# Patient Record
Sex: Female | Born: 2000 | Hispanic: No | Smoking: Never smoker
Health system: Southern US, Community
[De-identification: ages and names within clinical notes are randomized; demographics above are authoritative.]

## PROBLEM LIST (undated history)

## (undated) DIAGNOSIS — M2140 Flat foot [pes planus] (acquired), unspecified foot: Secondary | ICD-10-CM

## (undated) DIAGNOSIS — Z789 Other specified health status: Secondary | ICD-10-CM

## (undated) DIAGNOSIS — R519 Headache, unspecified: Secondary | ICD-10-CM

## (undated) DIAGNOSIS — D649 Anemia, unspecified: Secondary | ICD-10-CM

## (undated) HISTORY — DX: Headache, unspecified: R51.9

## (undated) HISTORY — DX: Flat foot (pes planus) (acquired), unspecified foot: M21.40

## (undated) HISTORY — DX: Other specified health status: Z78.9

## (undated) HISTORY — PX: NOSE SURGERY: SHX723

## (undated) HISTORY — PX: WISDOM TOOTH EXTRACTION: SHX21

---
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
## 2018-02-10 ENCOUNTER — Telehealth: Payer: Self-pay

## 2018-02-10 NOTE — Telephone Encounter (Signed)
Returned call and pt stated that she has been having irregular bleeding.  Advised pt that she recently delivered, had an IUD removed Dec 2019 and was on [facility] pills this month, so this all contributes to irregular bleeding. Pt states that she stopped BC pills recently, advised to let us know if symptoms do not get better.

## 2018-07-10 ENCOUNTER — Emergency Department (HOSPITAL_COMMUNITY)
Admission: EM | Admit: 2018-07-10 | Discharge: 2018-07-10 | Disposition: A | Discharge status: Home / Self Care | Payer: Medicaid Other | Attending: Emergency Medicine | Admitting: Emergency Medicine

## 2018-07-10 ENCOUNTER — Other Ambulatory Visit: Payer: Self-pay

## 2018-07-10 ENCOUNTER — Encounter (HOSPITAL_COMMUNITY): Payer: Self-pay | Admitting: Emergency Medicine

## 2018-07-10 DIAGNOSIS — Z3201 Encounter for pregnancy test, result positive: Secondary | ICD-10-CM | POA: Diagnosis not present

## 2018-07-10 DIAGNOSIS — N912 Amenorrhea, unspecified: Secondary | ICD-10-CM | POA: Diagnosis present

## 2018-07-10 LAB — COMPREHENSIVE METABOLIC PANEL
ALT: 12 U/L (ref 0–44)
AST: 30 U/L (ref 15–41)
Albumin: 3.5 g/dL (ref 3.5–5.0)
Alkaline Phosphatase: 95 U/L (ref 38–126)
Anion gap: 7 (ref 5–15)
BUN: 7 mg/dL (ref 6–20)
CO2: 22 mmol/L (ref 22–32)
Calcium: 8.8 mg/dL — ABNORMAL LOW (ref 8.9–10.3)
Chloride: 108 mmol/L (ref 98–111)
Creatinine, Ser: 0.69 mg/dL (ref 0.44–1.00)
GFR calc Af Amer: >60 mL/min (ref >60)
GFR calc non Af Amer: >60 mL/min (ref >60)
Glucose, Bld: 82 mg/dL (ref 70–99)
Potassium: 3.4 mmol/L — ABNORMAL LOW (ref 3.5–5.1)
Sodium: 137 mmol/L (ref 135–145)
Total Bilirubin: 0.2 mg/dL — ABNORMAL LOW (ref 0.3–1.2)
Total Protein: 7.9 g/dL (ref 6.5–8.1)

## 2018-07-10 LAB — HCG, QUANTITATIVE, PREGNANCY: hCG, Beta Chain, Quant, S: 66577 m[IU]/mL — ABNORMAL HIGH (ref <5)

## 2018-07-10 LAB — CBC
HCT: 38.1 % (ref 36.0–46.0)
Hemoglobin: 12.6 g/dL (ref 12.0–15.0)
MCH: 29.7 pg (ref 26.0–34.0)
MCHC: 33.1 g/dL (ref 30.0–36.0)
MCV: 89.9 fL (ref 80.0–100.0)
Platelets: 245 10*3/uL (ref 150–400)
RBC: 4.24 MIL/uL (ref 3.87–5.11)
RDW: 13.1 % (ref 11.5–15.5)
WBC: 9.9 10*3/uL (ref 4.0–10.5)
nRBC: 0 % (ref 0.0–0.2)

## 2018-07-10 NOTE — ED Triage Notes (Signed)
Pt c/o lower abd cramping and wants to see how far a long pregnant she is as she had positive tests.

## 2018-07-10 NOTE — ED Provider Notes (Signed)
[facility] DEPT Provider Note   CSN: [id number] Arrival date & time: 07/10/18  1458    History   Chief Complaint Chief Complaint  Patient presents with  . Abdominal Pain  . Possible Pregnancy    HPI Patricia Koch is a 18 y.o. female.     HPI   Patricia Koch is a 18 y.o. female, with a history of anemia, presenting to the ED with concern for pregnancy.  She states she had a positive home pregnancy test and would like to know how far along she is. LMP April 17.  She has been taking prenatal vitamins. Her OB/GYN is Dr. Jodi Mourning.  She has not yet set up an appointment. Denies fever/chills, N/V/D, abdominal pain, abnormal vaginal discharge, vaginal bleeding, chest pain, shortness of breath, cough, urinary symptoms, or any other complaints.    Past Medical History:  Diagnosis Date  . Anemia   . Medical history non-contributory   . Pes planus     Patient Active Problem List   Diagnosis Date Noted  . Indication for care in labor and delivery, antepartum 08/22/2017  . Pregnancy 12/23/2016  . Rib pain on right side 02/18/2015  . Pes planus 03/10/2012    Past Surgical History:  Procedure Laterality Date  . NOSE SURGERY    . WISDOM TOOTH EXTRACTION       OB History    Gravida  2   Para  1   Term  1   Preterm      AB      Living  1     SAB      TAB      Ectopic      Multiple  0   Live Births  1            Home Medications    Prior to Admission medications   Medication Sig Start Date End Date Taking? Authorizing Provider  Docosahexaenoic Acid (PRENATAL DHA PO) Take 1 tablet by mouth daily.    [provider]  ibuprofen (ADVIL,MOTRIN) 600 MG tablet Take 1 tablet (600 mg total) by mouth every 6 (six) hours. Patient not taking: Reported on 09/19/2017 08/24/17   Benay Pike, MD  Levonorgest-Eth Estrad-Fe Bisg (BALCOLTRA) 0.1-20 MG-MCG(21) TABS Take 1 tablet by mouth daily. 12/23/17   Shelly Bombard, MD   tinidazole (TINDAMAX) 500 MG tablet Take 2 tablets (1,000 mg total) by mouth daily with breakfast. Patient not taking: Reported on 12/23/2017 11/15/17   Shelly Bombard, MD    Family History Family History  Problem Relation Age of Onset  . Cancer Paternal Grandmother     Social History Social History   Tobacco Use  . Smoking status: Never Smoker  . Smokeless tobacco: Never Used  Substance Use Topics  . Alcohol use: Not Currently  . Drug use: Never     Allergies   Patient has no known allergies.   Review of Systems Review of Systems  Constitutional: Negative for chills and fever.  Respiratory: Negative for cough and shortness of breath.   Cardiovascular: Negative for chest pain and leg swelling.  Gastrointestinal: Negative for abdominal pain, diarrhea, nausea and vomiting.  Genitourinary: Negative for dysuria and hematuria.       Pregnancy  Musculoskeletal: Negative for back pain.  Neurological: Negative for weakness.  All other systems reviewed and are negative.    Physical Exam Updated Vital Signs BP 128/83 (BP Location: Left Arm)   Pulse 80  Temp 98.6 F (37 C) (Oral)   Resp 17   SpO2 99%   Physical Exam Vitals signs and nursing note reviewed.  Constitutional:      General: She is not in acute distress.    Appearance: She is well-developed. She is not diaphoretic.  HENT:     Head: Normocephalic and atraumatic.     Mouth/Throat:     Mouth: Mucous membranes are moist.     Pharynx: Oropharynx is clear.  Eyes:     Conjunctiva/sclera: Conjunctivae normal.  Neck:     Musculoskeletal: Neck supple.  Cardiovascular:     Rate and Rhythm: Normal rate and regular rhythm.     Pulses: Normal pulses.          Radial pulses are 2+ on the right side and 2+ on the left side.       Posterior tibial pulses are 2+ on the right side and 2+ on the left side.  Pulmonary:     Effort: Pulmonary effort is normal. No respiratory distress.  Abdominal:     Palpations:  Abdomen is soft.     Tenderness: There is no abdominal tenderness. There is no guarding.  Musculoskeletal:     Right lower leg: No edema.     Left lower leg: No edema.  Lymphadenopathy:     Cervical: No cervical adenopathy.  Skin:    General: Skin is warm and dry.  Neurological:     Mental Status: She is alert.  Psychiatric:        Mood and Affect: Mood and affect normal.        Speech: Speech normal.        Behavior: Behavior normal.      ED Treatments / Results  Labs (all labs ordered are listed, but only abnormal results are displayed) Labs Reviewed  COMPREHENSIVE METABOLIC PANEL - Abnormal; Notable for the following components:      Result Value   Potassium 3.4 (*)    Calcium 8.8 (*)    Total Bilirubin 0.2 (*)    All other components within normal limits  HCG, QUANTITATIVE, PREGNANCY - Abnormal; Notable for the following components:   hCG, Beta Chain, Quant, S [id number] (*)    All other components within normal limits  CBC    EKG None  Radiology No results found.  Procedures Procedures (including critical care time)  Medications Ordered in ED Medications - No data to display   Initial Impression / Assessment and Plan / ED Course  I have reviewed the triage vital signs and the nursing notes.  Pertinent labs & imaging results that were available during my care of the patient were reviewed by me and considered in my medical decision making (see chart for details).        Patient presents requesting to know how far along her pregnancy is.  She does have a positive hCG quant.  We discussed estimated gestational age based on last menstrual cycle, however, we also discussed the possible inaccuracies with the using this method.  Recommend she follow-up with OB/GYN for any further evaluation. Triage note mentions abdominal cramping.  I specifically asked the patient about any abdominal discomfort, pain, cramping, but patient denies any of these symptoms. We also  discussed her mild hypokalemia. Return precautions discussed. Patient voices understanding of these instructions, accepts the plan, and is comfortable with discharge.  Final Clinical Impressions(s) / ED Diagnoses   Final diagnoses:  Positive pregnancy test    ED Discharge Orders  None       Concepcion LivingJoy,  C, PA-C 07/10/18 1721    Bethann BerkshireZammit, Joseph, MD 07/11/18 1247

## 2018-07-10 NOTE — Discharge Instructions (Addendum)
Your pregnancy test was positive.  Follow-up with OB/GYN for any further management.  You had very mild decreased potassium.  This may be retested and addressed through OB/GYN or a primary care provider.  Should you begin to experience abdominal pain, fever, vaginal bleeding, or any emergent pregnancy related complaints, proceed to the emergency department at the [facility].

## 2018-07-24 DIAGNOSIS — Z3480 Encounter for supervision of other normal pregnancy, unspecified trimester: Secondary | ICD-10-CM | POA: Insufficient documentation

## 2018-07-25 ENCOUNTER — Ambulatory Visit (INDEPENDENT_AMBULATORY_CARE_PROVIDER_SITE_OTHER): Payer: BLUE CROSS/BLUE SHIELD

## 2018-07-25 DIAGNOSIS — Z3480 Encounter for supervision of other normal pregnancy, unspecified trimester: Secondary | ICD-10-CM

## 2018-07-25 NOTE — Progress Notes (Signed)
I connected with  Patricia Koch on 07/25/18 by a video enabled telemedicine application and verified that I am speaking with the correct person using two identifiers.   I discussed the limitations of evaluation and management by telemedicine. The patient expressed understanding and agreed to proceed.  Completed NOB Intake and inform patient of NOB Appt. 08/07/2018 @2 :15pm

## 2018-08-07 ENCOUNTER — Ambulatory Visit (INDEPENDENT_AMBULATORY_CARE_PROVIDER_SITE_OTHER): Payer: Medicaid Other | Admitting: Advanced Practice Midwife

## 2018-08-07 ENCOUNTER — Other Ambulatory Visit (HOSPITAL_COMMUNITY)
Admission: RE | Admit: 2018-08-07 | Discharge: 2018-08-07 | Disposition: A | Discharge status: Home / Self Care | Payer: Medicaid Other | Source: Ambulatory Visit | Attending: Advanced Practice Midwife | Admitting: Advanced Practice Midwife

## 2018-08-07 ENCOUNTER — Other Ambulatory Visit: Payer: Self-pay

## 2018-08-07 ENCOUNTER — Encounter: Payer: Self-pay | Admitting: Advanced Practice Midwife

## 2018-08-07 VITALS — BP 108/73 | HR 76 | Wt 156.0 lb

## 2018-08-07 DIAGNOSIS — O09892 Supervision of other high risk pregnancies, second trimester: Secondary | ICD-10-CM

## 2018-08-07 DIAGNOSIS — Z3481 Encounter for supervision of other normal pregnancy, first trimester: Secondary | ICD-10-CM

## 2018-08-07 DIAGNOSIS — Z3480 Encounter for supervision of other normal pregnancy, unspecified trimester: Secondary | ICD-10-CM | POA: Diagnosis not present

## 2018-08-07 DIAGNOSIS — Z3A18 18 weeks gestation of pregnancy: Secondary | ICD-10-CM

## 2018-08-07 DIAGNOSIS — Z363 Encounter for antenatal screening for malformations: Secondary | ICD-10-CM

## 2018-08-07 DIAGNOSIS — Z3482 Encounter for supervision of other normal pregnancy, second trimester: Secondary | ICD-10-CM | POA: Insufficient documentation

## 2018-08-07 MED ORDER — BLOOD PRESSURE MONITOR KIT: 1.0000 | PACK | Freq: Once | 0 refills | Status: AC

## 2018-08-07 MED ORDER — VITAFOL GUMMIES 3.33-0.333-34.8 MG PO CHEW: 1.0000 | CHEWABLE_TABLET | Freq: Every day | ORAL | 4 refills | Status: DC

## 2018-08-07 NOTE — Patient Instructions (Signed)
How a Baby Grows During Pregnancy  Pregnancy begins when a female's sperm enters a female's egg (fertilization). Fertilization usually happens in one of the tubes (fallopian tubes) that connect the ovaries to the womb (uterus). The fertilized egg moves down the fallopian tube to the uterus. Once it reaches the uterus, it implants into the lining of the uterus and begins to grow. For the first 10 weeks, the fertilized egg is called an embryo. After 10 weeks, it is called a fetus. As the fetus continues to grow, it receives oxygen and nutrients through tissue (placenta) that grows to support the developing baby. The placenta is the life support system for the baby. It provides oxygen and nutrition and removes waste. Learning as much as you can about your pregnancy and how your baby is developing can help you enjoy the experience. It can also make you aware of when there might be a problem and when to ask questions. How long does a typical pregnancy last? A pregnancy usually lasts 280 days, or about 40 weeks. Pregnancy is divided into three periods of growth, also called trimesters:  First trimester: 0-12 weeks.  Second trimester: 13-27 weeks.  Third trimester: 28-40 weeks. The day when your baby is ready to be born (full term) is your estimated date of delivery. How does my baby develop month by month? First month  The fertilized egg attaches to the inside of the uterus.  Some cells will form the placenta. Others will form the fetus.  The arms, legs, brain, spinal cord, lungs, and heart begin to develop.  At the end of the first month, the heart begins to beat. Second month  The bones, inner ear, eyelids, hands, and feet form.  The genitals develop.  By the end of 8 weeks, all major organs are developing. Third month  All of the internal organs are forming.  Teeth develop below the gums.  Bones and muscles begin to grow. The spine can flex.  The skin is transparent.  Fingernails  and toenails begin to form.  Arms and legs continue to grow longer, and hands and feet develop.  The fetus is about 3 inches (7.6 cm) long. Fourth month  The placenta is completely formed.  The external sex organs, neck, outer ear, eyebrows, eyelids, and fingernails are formed.  The fetus can hear, swallow, and move its arms and legs.  The kidneys begin to produce urine.  The skin is covered with a white, waxy coating (vernix) and very fine hair (lanugo). Fifth month  The fetus moves around more and can be felt for the first time (quickening).  The fetus starts to sleep and wake up and may begin to suck its finger.  The nails grow to the end of the fingers.  The organ in the digestive system that makes bile (gallbladder) functions and helps to digest nutrients.  If your baby is a girl, eggs are present in her ovaries. If your baby is a boy, testicles start to move down into his scrotum. Sixth month  The lungs are formed.  The eyes open. The brain continues to develop.  Your baby has fingerprints and toe prints. Your baby's hair grows thicker.  At the end of the second trimester, the fetus is about 9 inches (22.9 cm) long. Seventh month  The fetus kicks and stretches.  The eyes are developed enough to sense changes in light.  The hands can make a grasping motion.  The fetus responds to sound. Eighth month  All   organs and body systems are fully developed and functioning.  Bones harden, and taste buds develop. The fetus may hiccup.  Certain areas of the brain are still developing. The skull remains soft. Ninth month  The fetus gains about  lb (0.23 kg) each week.  The lungs are fully developed.  Patterns of sleep develop.  The fetus's head typically moves into a head-down position (vertex) in the uterus to prepare for birth.  The fetus weighs 6-9 lb (2.72-4.08 kg) and is 19-20 inches (48.26-50.8 cm) long. What can I do to have a healthy pregnancy and help  my baby develop? General instructions  Take prenatal vitamins as directed by your health care provider. These include vitamins such as folic acid, iron, calcium, and vitamin D. They are important for healthy development.  Take medicines only as directed by your health care provider. Read labels and ask a pharmacist or your health care provider whether over-the-counter medicines, supplements, and prescription drugs are safe to take during pregnancy.  Keep all follow-up visits as directed by your health care provider. This is important. Follow-up visits include prenatal care and screening tests. How do I know if my baby is developing well? At each prenatal visit, your health care provider will do several different tests to check on your health and keep track of your baby's development. These include:  Fundal height and position. ? Your health care provider will measure your growing belly from your pubic bone to the top of the uterus using a tape measure. ? Your health care provider will also feel your belly to determine your baby's position.  Heartbeat. ? An ultrasound in the first trimester can confirm pregnancy and show a heartbeat, depending on how far along you are. ? Your health care provider will check your baby's heart rate at every prenatal visit.  Second trimester ultrasound. ? This ultrasound checks your baby's development. It also may show your baby's gender. What should I do if I have concerns about my baby's development? Always talk with your health care provider about any concerns that you may have about your pregnancy and your baby. Summary  A pregnancy usually lasts 280 days, or about 40 weeks. Pregnancy is divided into three periods of growth, also called trimesters.  Your health care provider will monitor your baby's growth and development throughout your pregnancy.  Follow your health care provider's recommendations about taking prenatal vitamins and medicines during  your pregnancy.  Talk with your health care provider if you have any concerns about your pregnancy or your developing baby. This information is not intended to replace advice given to you by your health care provider. Make sure you discuss any questions you have with your health care provider. Document Released: 06/16/2007 Document Revised: 04/20/2018 Document Reviewed: 11/10/2016 Elsevier Patient Education  2020 Elsevier Inc.  

## 2018-08-07 NOTE — Progress Notes (Signed)
Pt states some period like cramps.

## 2018-08-07 NOTE — Progress Notes (Signed)
History:    Patricia Koch is being seen today for her first obstetrical visit.  This is not a planned pregnancy. She is at 6445w3d gestation by LMP 04/28/18. Her obstetrical history is significant for Teen pregnancy, close pregnancy spacing. Lives with Mom. Relationship with FOB: significant other, not living together. Patient does intend to breast and bottle feed. Pregnancy history fully reviewed.   Gynecologic History Patient's last menstrual period was 04/28/2018. Last Pap: NA (age 18).  Obstetric History OB History  Gravida Para Term Preterm AB Living  2 1 1     1   SAB TAB Ectopic Multiple Live Births        0 1    # Outcome Date GA Lbr Len/2nd Weight Sex Delivery Anes PTL Lv  2 Current           1 Term 08/22/17 2920w4d 07:35 5 lb 13 oz (2.635 kg) F Vag-Spont None  LIV    Past Medical History:  Diagnosis Date  . Anemia   . Headache   . Medical history non-contributory   . Pes planus     Past Surgical History:  Procedure Laterality Date  . NOSE SURGERY    . WISDOM TOOTH EXTRACTION      Current Outpatient Medications on File Prior to Visit  Medication Sig Dispense Refill  . Docosahexaenoic Acid (PRENATAL DHA PO) Take 1 tablet by mouth daily.     No current facility-administered medications on file prior to visit.     No Known Allergies  Social History:  reports that she has never smoked. She has never used smokeless tobacco. She reports previous alcohol use. She reports that she does not use drugs.  Family History  Problem Relation Age of Onset  . Cancer Paternal Grandmother   . Miscarriages / [place] Mother   . Cancer Maternal Grandmother   . Diabetes Maternal Grandmother   . Hypertension Maternal Grandmother     The following portions of the patient's history were reviewed and updated as appropriate: allergies, current medications, past family history, past medical history, past social history, past surgical history and problem list.  Review of  Systems Review of Systems  Constitutional: Negative for appetite change, chills and fever.  Gastrointestinal: Negative for abdominal pain, diarrhea and vomiting.  Genitourinary: Negative for dysuria, vaginal bleeding and vaginal discharge.      Physical Exam:  BP 108/73   Pulse 76   Wt 156 lb (70.8 kg)   LMP 04/28/2018   BMI 28.53 kg/m  CONSTITUTIONAL: Well-developed, well-nourished female in no acute distress.  HENT:  Normocephalic, atraumatic, External right and left ear normal. Oropharynx is clear and moist EYES: Conjunctivae normal. No scleral icterus.  NECK: Normal range of motion, supple, no masses.  Normal thyroid.  SKIN: Skin is warm and dry. No rash noted. Not diaphoretic. No erythema. No pallor. MUSCULOSKELETAL: Normal range of motion. No tenderness.  No cyanosis, clubbing, or edema.   NEUROLOGIC: Alert and oriented to person, place, and time. Normal reflexes, muscle tone coordination.  PSYCHIATRIC: Normal mood and affect. Normal behavior. Normal judgment and thought content. CARDIOVASCULAR: Normal heart rate noted, regular rhythm RESPIRATORY: Clear to auscultation bilaterally. Effort and breath sounds normal, no problems with respiration noted. BREASTS: Symmetric in size. No masses, skin changes, nipple drainage, or lymphadenopathy. ABDOMEN: Soft, normal bowel sounds, no distention noted.  No tenderness, rebound or guarding. Fundus @ SP  PELVIC: Declined. Pap smear not obtained due to age.     FHR  165  Assessment and Plan:  1. [redacted] weeks gestation of pregnancy  - Enroll patient in [employer] Program - [place] MFM OB COMP + 14 WK; Future  2. Short interval between pregnancies affecting pregnancy in second trimester, antepartum  - Enroll patient in [employer]cripts Program - [place] MFM OB COMP + 14 WK; Future  3. Antenatal screening for malformation using ultrasonics  - Enroll patient in [employer]s Program - [place] MFM OB COMP + 14 WK; Future  4. Encounter for supervision of  other normal pregnancy in second trimester  - Enroll patient in Bab[employer]cripts Program - [place] MFM OB COMP + 14 WK; Future  5. Pregnancy in adolescent 20 years of age or older with history of previous pregnancy, second trimester  - Enroll patient in Babyscripts Program - [place] MFM OB COMP + 81 WK; Future  6. Supervision of other normal pregnancy, antepartum     Initial labs drawn. Prenatal vitamins. Problem list reviewed and updated. Genetic screening discussed: NIPS/First trimester screen/Quad/AFP declined. Role of ultrasound in pregnancy discussed; fetal survey: ordered. Amniocentesis discussed: not indicated. Follow up in 4 weeks. Discussed clinic routines, schedule of care and testing, genetic screening options, involvement of students and residents under the direct supervision of APPs and doctors and presence of female providers. Pt verbalized understanding. Manya Silvas, [employer] for [employer], [place]

## 2018-08-10 LAB — OBSTETRIC PANEL, INCLUDING HIV
Antibody Screen: NEGATIVE
Basophils Absolute: 0.1 10*3/uL (ref 0.0–0.2)
Basos: 1 %
EOS (ABSOLUTE): 0.3 10*3/uL (ref 0.0–0.4)
Eos: 3 %
HIV Screen 4th Generation wRfx: NONREACTIVE
Hematocrit: 37.4 % (ref 34.0–46.6)
Hemoglobin: 12.4 g/dL (ref 11.1–15.9)
Hepatitis B Surface Ag: NEGATIVE
Immature Grans (Abs): 0 10*3/uL (ref 0.0–0.1)
Immature Granulocytes: 0 %
Lymphocytes Absolute: 2 10*3/uL (ref 0.7–3.1)
Lymphs: 21 %
MCH: 28.9 pg (ref 26.6–33.0)
MCHC: 33.2 g/dL (ref 31.5–35.7)
MCV: 87 fL (ref 79–97)
Monocytes Absolute: 0.7 10*3/uL (ref 0.1–0.9)
Monocytes: 8 %
Neutrophils Absolute: 6.6 10*3/uL (ref 1.4–7.0)
Neutrophils: 67 %
Platelets: 245 10*3/uL (ref 150–450)
RBC: 4.29 x10E6/uL (ref 3.77–5.28)
RDW: 13.8 % (ref 11.7–15.4)
RPR Ser Ql: NONREACTIVE
Rh Factor: POSITIVE
Rubella Antibodies, IGG: 2.14 index (ref >0.99)
WBC: 9.7 10*3/uL (ref 3.4–10.8)

## 2018-08-10 LAB — URINE CULTURE, OB REFLEX

## 2018-08-10 LAB — URINE CYTOLOGY ANCILLARY ONLY
Chlamydia: POSITIVE — AB
Neisseria Gonorrhea: NEGATIVE

## 2018-08-10 LAB — CULTURE, OB URINE

## 2018-08-14 ENCOUNTER — Other Ambulatory Visit: Payer: Self-pay

## 2018-08-14 ENCOUNTER — Encounter: Payer: Self-pay | Admitting: Advanced Practice Midwife

## 2018-08-14 ENCOUNTER — Other Ambulatory Visit: Payer: Self-pay | Admitting: Medical

## 2018-08-14 DIAGNOSIS — R8271 Bacteriuria: Secondary | ICD-10-CM

## 2018-08-14 DIAGNOSIS — O2340 Unspecified infection of urinary tract in pregnancy, unspecified trimester: Secondary | ICD-10-CM | POA: Insufficient documentation

## 2018-08-14 DIAGNOSIS — A749 Chlamydial infection, unspecified: Secondary | ICD-10-CM

## 2018-08-14 DIAGNOSIS — O99891 Other specified diseases and conditions complicating pregnancy: Secondary | ICD-10-CM

## 2018-08-14 DIAGNOSIS — Z3482 Encounter for supervision of other normal pregnancy, second trimester: Secondary | ICD-10-CM

## 2018-08-14 MED ORDER — AZITHROMYCIN 500 MG PO TABS: 1000.0000 mg | ORAL_TABLET | Freq: Once | ORAL | 0 refills | Status: AC

## 2018-08-14 MED ORDER — CEPHALEXIN 500 MG PO CAPS: 500.0000 mg | ORAL_CAPSULE | Freq: Four times a day (QID) | ORAL | 0 refills | Status: DC

## 2018-08-14 NOTE — Progress Notes (Signed)
Pt made aware of chlamydia infection and rx sent to pharmacy. Advised pt need for partner treatment and advised no intercourse until they both finish treatment. Pt verbalized understanding.

## 2018-08-21 ENCOUNTER — Telehealth: Payer: Self-pay

## 2018-08-21 ENCOUNTER — Encounter: Payer: Self-pay | Admitting: Obstetrics and Gynecology

## 2018-08-21 DIAGNOSIS — Z3480 Encounter for supervision of other normal pregnancy, unspecified trimester: Secondary | ICD-10-CM

## 2018-08-21 MED ORDER — BLOOD PRESSURE MONITORING KIT: 1.0000 | PACK | 0 refills | Status: DC

## 2018-08-21 NOTE — Telephone Encounter (Signed)
Returned call, advised of U/S appt and advised pt that we would send request for BP cuff.

## 2018-08-29 DIAGNOSIS — Z3480 Encounter for supervision of other normal pregnancy, unspecified trimester: Secondary | ICD-10-CM | POA: Diagnosis not present

## 2018-09-04 ENCOUNTER — Ambulatory Visit (HOSPITAL_COMMUNITY): Payer: Medicaid Other

## 2018-09-05 ENCOUNTER — Telehealth (INDEPENDENT_AMBULATORY_CARE_PROVIDER_SITE_OTHER): Payer: Medicaid Other | Admitting: Obstetrics

## 2018-09-05 ENCOUNTER — Encounter: Payer: Self-pay | Admitting: Obstetrics

## 2018-09-05 DIAGNOSIS — Z3A18 18 weeks gestation of pregnancy: Secondary | ICD-10-CM

## 2018-09-05 DIAGNOSIS — Z3480 Encounter for supervision of other normal pregnancy, unspecified trimester: Secondary | ICD-10-CM

## 2018-09-05 DIAGNOSIS — O99820 Streptococcus B carrier state complicating pregnancy: Secondary | ICD-10-CM

## 2018-09-05 DIAGNOSIS — R8271 Bacteriuria: Secondary | ICD-10-CM

## 2018-09-05 DIAGNOSIS — O99891 Other specified diseases and conditions complicating pregnancy: Secondary | ICD-10-CM

## 2018-09-05 DIAGNOSIS — O09892 Supervision of other high risk pregnancies, second trimester: Secondary | ICD-10-CM

## 2018-09-05 DIAGNOSIS — O98812 Other maternal infectious and parasitic diseases complicating pregnancy, second trimester: Secondary | ICD-10-CM

## 2018-09-05 DIAGNOSIS — A749 Chlamydial infection, unspecified: Secondary | ICD-10-CM

## 2018-09-05 DIAGNOSIS — O9989 Other specified diseases and conditions complicating pregnancy, childbirth and the puerperium: Secondary | ICD-10-CM

## 2018-09-05 NOTE — Progress Notes (Signed)
   TELEHEALTH OBSTETRICS PRENATAL VIRTUAL VIDEO VISIT ENCOUNTER NOTE  Provider location: [facility] [place] [facility] [place]   I connected with Patricia Koch on 09/05/18 at 10:30 AM EDT by WebEx OB MyChart Video Encounter at home and verified that I am speaking with the correct person using two identifiers.   I discussed the limitations, risks, security and privacy concerns of performing an evaluation and management service virtually and the availability of in person appointments. I also discussed with the patient that there may be a patient responsible charge related to this service. The patient expressed understanding and agreed to proceed.  Subjective:  Patricia Koch is a 18 y.o. G2P1001 at [redacted]w[redacted]d being seen today for ongoing prenatal care.  She is currently monitored for the following issues for this low-risk pregnancy and has Pes planus; Rib pain on right side; Supervision of other normal pregnancy, antepartum; Short interval between pregnancies affecting pregnancy in second trimester, antepartum; Pregnancy in adolescent 20 years of age or older with history of previous pregnancy, second trimester; Chlamydia infection affecting pregnancy; and UTI (urinary tract infection) during pregnancy on their problem list.  Patient reports headache.   . Vag. Bleeding: None.   . Denies any leaking of fluid.   The following portions of the patient's history were reviewed and updated as appropriate: allergies, current medications, past family history, past medical history, past social history, past surgical history and problem list.   Objective:  There were no vitals filed for this visit.  Fetal Status:           General:  Alert, oriented and cooperative. Patient is in no acute distress.  Respiratory: Normal respiratory effort, no problems with respiration noted  Mental Status: Normal mood and affect. Normal behavior. Normal judgment and thought content.  Rest of physical exam deferred due to  type of encounter  Imaging: No results found.  Assessment and Plan:  Pregnancy: G2P1001 at [redacted]w[redacted]d 1. Supervision of other normal pregnancy, antepartum  2. Short interval between pregnancies affecting pregnancy in second trimester, antepartum  3. Chlamydia infection affecting pregnancy in second trimester - TOC cultures at 28 weeks  4. Asymptomatic bacteriuria during pregnancy in second trimester - repeat urine culture at 28 weeks  Preterm labor symptoms and general obstetric precautions including but not limited to vaginal bleeding, contractions, leaking of fluid and fetal movement were reviewed in detail with the patient. I discussed the assessment and treatment plan with the patient. The patient was provided an opportunity to ask questions and all were answered. The patient agreed with the plan and demonstrated an understanding of the instructions. The patient was advised to call back or seek an in-person office evaluation/go to MAU at [facility] for any urgent or concerning symptoms. Please refer to After Visit Summary for other counseling recommendations.   I provided 10 minutes of face-to-face time during this encounter.  Return in about 4 weeks (around 10/03/2018) for MyChart.  Future Appointments  Date Time Provider [place]  09/12/2018  9:15 AM WH-MFC [place] 4 WH-MFCUS MFC-US    Charles Harper, [place] [facility] [place] [facility] 09/05/2018

## 2018-09-05 NOTE — Progress Notes (Signed)
I connected with Patricia Koch on 09/05/18 at 10:30 AM EDT by telephone and verified that I am speaking with the correct person using two identifiers.  My Chart ROB visit: Does not have BP cuff with her today.

## 2018-09-12 ENCOUNTER — Other Ambulatory Visit (HOSPITAL_COMMUNITY): Payer: Self-pay | Admitting: *Deleted

## 2018-09-12 ENCOUNTER — Other Ambulatory Visit: Payer: Self-pay

## 2018-09-12 ENCOUNTER — Ambulatory Visit (HOSPITAL_COMMUNITY)
Admission: RE | Admit: 2018-09-12 | Discharge: 2018-09-12 | Disposition: A | Discharge status: Home / Self Care | Payer: Medicaid Other | Source: Ambulatory Visit | Attending: Obstetrics and Gynecology | Admitting: Obstetrics and Gynecology

## 2018-09-12 DIAGNOSIS — Z3480 Encounter for supervision of other normal pregnancy, unspecified trimester: Secondary | ICD-10-CM | POA: Insufficient documentation

## 2018-09-12 DIAGNOSIS — Z3A18 18 weeks gestation of pregnancy: Secondary | ICD-10-CM | POA: Diagnosis not present

## 2018-09-12 DIAGNOSIS — Z362 Encounter for other antenatal screening follow-up: Secondary | ICD-10-CM

## 2018-09-12 DIAGNOSIS — Z363 Encounter for antenatal screening for malformations: Secondary | ICD-10-CM

## 2018-09-12 DIAGNOSIS — O09892 Supervision of other high risk pregnancies, second trimester: Secondary | ICD-10-CM

## 2018-09-12 DIAGNOSIS — Z3482 Encounter for supervision of other normal pregnancy, second trimester: Secondary | ICD-10-CM | POA: Insufficient documentation

## 2018-10-03 ENCOUNTER — Encounter: Payer: Self-pay | Admitting: Obstetrics

## 2018-10-03 ENCOUNTER — Telehealth (INDEPENDENT_AMBULATORY_CARE_PROVIDER_SITE_OTHER): Payer: Medicaid Other | Admitting: Obstetrics

## 2018-10-03 DIAGNOSIS — Z3A18 18 weeks gestation of pregnancy: Secondary | ICD-10-CM

## 2018-10-03 DIAGNOSIS — O9989 Other specified diseases and conditions complicating pregnancy, childbirth and the puerperium: Secondary | ICD-10-CM

## 2018-10-03 DIAGNOSIS — R8271 Bacteriuria: Secondary | ICD-10-CM

## 2018-10-03 DIAGNOSIS — O0990 Supervision of high risk pregnancy, unspecified, unspecified trimester: Secondary | ICD-10-CM

## 2018-10-03 DIAGNOSIS — O09892 Supervision of other high risk pregnancies, second trimester: Secondary | ICD-10-CM

## 2018-10-03 DIAGNOSIS — N898 Other specified noninflammatory disorders of vagina: Secondary | ICD-10-CM

## 2018-10-03 DIAGNOSIS — A749 Chlamydial infection, unspecified: Secondary | ICD-10-CM

## 2018-10-03 DIAGNOSIS — O98812 Other maternal infectious and parasitic diseases complicating pregnancy, second trimester: Secondary | ICD-10-CM

## 2018-10-03 DIAGNOSIS — O99891 Other specified diseases and conditions complicating pregnancy: Secondary | ICD-10-CM

## 2018-10-03 DIAGNOSIS — O0992 Supervision of high risk pregnancy, unspecified, second trimester: Secondary | ICD-10-CM

## 2018-10-03 NOTE — Progress Notes (Signed)
TELEHEALTH OBSTETRICS PRENATAL VIRTUAL VIDEO VISIT ENCOUNTER NOTE  Provider location: [facility] [place] [facility] [place]   I connected with Patricia Koch on 10/03/18 at 10:45 AM EDT by OB MyChart Video Encounter at home and verified that I am speaking with the correct person using two identifiers.   I discussed the limitations, risks, security and privacy concerns of performing an evaluation and management service virtually and the availability of in person appointments. I also discussed with the patient that there may be a patient responsible charge related to this service. The patient expressed understanding and agreed to proceed. Subjective:  Patricia Koch is a 18 y.o. G2P1001 at [redacted]w[redacted]d being seen today for ongoing prenatal care.  She is currently monitored for the following issues for this high-risk pregnancy and has Pes planus; Supervision of other normal pregnancy, antepartum; Short interval between pregnancies affecting pregnancy in second trimester, antepartum; Chlamydia infection affecting pregnancy; and UTI (urinary tract infection) during pregnancy on their problem list.  Patient reports no complaints.   .  .   . Denies any leaking of fluid.   The following portions of the patient's history were reviewed and updated as appropriate: allergies, current medications, past family history, past medical history, past social history, past surgical history and problem list.   Objective:  There were no vitals filed for this visit.  Fetal Status:           General:  Alert, oriented and cooperative. Patient is in no acute distress.  Respiratory: Normal respiratory effort, no problems with respiration noted  Mental Status: Normal mood and affect. Normal behavior. Normal judgment and thought content.  Rest of physical exam deferred due to type of encounter  Imaging: [place] Mfm Ob Comp + 14 Wk  Result Date: 09/12/2018  ----------------------------------------------------------------------  OBSTETRICS REPORT                       (Signed Final 09/12/2018 05:47 pm) ---------------------------------------------------------------------- Patient Info  ID #:       [id number]                          D.O.B.:  14-Nov-2000 (18 yrs)  Name:       Patricia Koch                 Visit Date: 09/12/2018 09:24 am ---------------------------------------------------------------------- Performed By  Performed By:     Patricia Koch     Ref. Address:     [address] [place]  [place]                                                             [id number]  Attending:        Noralee Space MD        [facility]  Referred By:      [facility] ---------------------------------------------------------------------- Orders   #  Description                          Code         Ordered By   1  [place] MFM OB COMP + 14 WK               76805.01     Dorathy Kinsman  ----------------------------------------------------------------------   #  Order #                    Accession #                 Episode #   1  [id number]                  [id number]                  [id number]  ---------------------------------------------------------------------- Indications   Encounter for uncertain dates                  Z36.87   [redacted] weeks gestation of pregnancy                Z3A.15   Encounter for antenatal screening for          Z36.3   malformations (Declined genetic screening)   Short interval between pregancies, 2nd         O09.892   trimester   Teen pregnancy                                 O75.89  ----------------------------------------------------------------------  Fetal Evaluation  Num Of Fetuses:         1  Fetal Heart Rate(bpm):  141  Cardiac Activity:       Observed  Presentation:           Breech  Placenta:               Posterior  P. Cord Insertion:      Visualized, central  Amniotic Fluid  AFI FV:      Within normal limits                              Largest Pocket(cm)  4.79 ---------------------------------------------------------------------- Biometry  BPD:      32.1  mm     G. Age:  16w 0d         74  %    CI:        83.99   %    70 - 86                                                          FL/HC:      15.0   %    15.3 - 17.1  HC:      110.4  mm     G. Age:  15w 2d         27  %    HC/AC:      1.15        1.05 - 1.39  AC:       96.1  mm     G. Age:  15w 5d         63  %    FL/BPD:     51.7   %  FL:       16.6  mm     G. Age:  14w 6d         24  %    FL/AC:      17.3   %    20 - 24  HUM:      16.4  mm     G. Age:  14w 5d         29  %  CER:      14.6  mm     G. Age:  14w 4d         31  %  NFT:       1.9  mm  CM:        3.5  mm  Est. FW:     121  gm      0 lb 4 oz     32  % ---------------------------------------------------------------------- OB History  Gravidity:    2         Term:   1        Prem:   0        SAB:   0  TOP:          0       Ectopic:  0        Living: 1 ---------------------------------------------------------------------- Gestational Age  U/S Today:     15w 3d                                        EDD:   03/03/19  Best:          15w 3d     Det. By:  U/S (09/12/18)           EDD:   03/03/19 ---------------------------------------------------------------------- Anatomy  Cranium:               Appears normal         LVOT:                   Appears normal  Cavum:  Not well visualized    Aortic Arch:            Appears normal  Ventricles:            Not well visualized    Ductal Arch:            Not well visualized  Choroid Plexus:        Appears normal         Diaphragm:              Appears normal   Cerebellum:            Appears normal         Stomach:                Appears normal, left                                                                        sided  Posterior Fossa:       Appears normal         Abdomen:                Appears normal  Nuchal Fold:           Appears normal         Abdominal Wall:         Appears nml (cord                                                                        insert, abd wall)  Face:                  Appears normal         Cord Vessels:           Appears normal (3                         (orbits and profile)                           vessel cord)  Lips:                  Appears normal         Kidneys:                Appear normal  Palate:                Not well visualized    Bladder:                Appears normal  Thoracic:              Appears normal         Spine:                  Limited views  appear normal  Heart:                 Not well visualized    Upper Extremities:      Appears normal  RVOT:                  Not well visualized    Lower Extremities:      Visualized  Other:  Fetus appears to be female. ---------------------------------------------------------------------- Cervix Uterus Adnexa  Cervix  Length:              4  cm.  Normal appearance by transabdominal scan.  Left Ovary  Within normal limits.  Right Ovary  Within normal limits.  Adnexa  No abnormality visualized. ---------------------------------------------------------------------- Impression  Fetal biometry is consistent with 15w 3d gestation. Amniotic  fluid is normal and good fetal activity is seen. Fetal  anatomical survey is very limited because of early gestational  age.  Patient had opted not to screen for fetal aneuploidies.  We have assigned the EDD at 03/04/2019. ---------------------------------------------------------------------- Recommendations  -An appointment was made for her to return in 4 weeks for   completion of fetal anatomy. ----------------------------------------------------------------------                  Noralee Space, MD Electronically Signed Final Report   09/12/2018 05:47 pm ----------------------------------------------------------------------   Assessment and Plan:  Pregnancy: G2P1001 at [redacted]w[redacted]d 1. Supervision of high risk pregnancy, antepartu  2. Short interval between pregnancies affecting pregnancy in second trimester, antepartum  3. Asymptomatic bacteriuria during pregnancy in second trimester, treated - treat in labor  4. Chlamydia infection affecting pregnancy in second trimester - TOC at 28 weeks   Preterm labor symptoms and general obstetric precautions including but not limited to vaginal bleeding, contractions, leaking of fluid and fetal movement were reviewed in detail with the patient. I discussed the assessment and treatment plan with the patient. The patient was provided an opportunity to ask questions and all were answered. The patient agreed with the plan and demonstrated an understanding of the instructions. The patient was advised to call back or seek an in-person office evaluation/go to [facility]U at [facility] for any urgent or concerning symptoms. Please refer to After Visit Summary for other counseling recommendations.   I provided 10 minutes of face-to-face time during this encounter.  Follow up in 4 weeks.  My Chart.  Future Appointments  Date Time Provider Department Center  10/17/2018  8:30 AM WH-MFC [place] 5 WH-MFCUS MFC-US    Coral Ceo, MD [facility] for [facility], [facility] 10/03/2018

## 2018-10-03 NOTE — Addendum Note (Signed)
Addended by: Baltazar Najjar A on: 10/03/2018 12:14 PM   Modules accepted: Orders

## 2018-10-04 ENCOUNTER — Other Ambulatory Visit: Payer: Self-pay | Admitting: Obstetrics

## 2018-10-04 ENCOUNTER — Ambulatory Visit (INDEPENDENT_AMBULATORY_CARE_PROVIDER_SITE_OTHER): Payer: Medicaid Other | Admitting: Obstetrics

## 2018-10-04 ENCOUNTER — Other Ambulatory Visit: Payer: Self-pay

## 2018-10-04 ENCOUNTER — Other Ambulatory Visit (HOSPITAL_COMMUNITY)
Admission: RE | Admit: 2018-10-04 | Discharge: 2018-10-04 | Disposition: A | Discharge status: Home / Self Care | Payer: Medicaid Other | Source: Ambulatory Visit | Attending: Obstetrics | Admitting: Obstetrics

## 2018-10-04 DIAGNOSIS — O26893 Other specified pregnancy related conditions, third trimester: Secondary | ICD-10-CM

## 2018-10-04 DIAGNOSIS — N898 Other specified noninflammatory disorders of vagina: Secondary | ICD-10-CM | POA: Insufficient documentation

## 2018-10-04 NOTE — Progress Notes (Signed)
Pt presents for vaginal discharge with odor. Sx started about a month ago. Pt denies having any itching or irritation. Pt collected self swab. Pt informed results may take 24-48 hours to come back.  Patient seen and assessed by nursing staff during this encounter. I have reviewed the chart and agree with the documentation and plan.  Baltazar Najjar, MD 10/06/2018 10:11 AM

## 2018-10-06 LAB — CERVICOVAGINAL ANCILLARY ONLY
Bacterial Vaginitis (gardnerella): POSITIVE — AB
Candida Glabrata: NEGATIVE
Candida Vaginitis: NEGATIVE
Molecular Disclaimer: NEGATIVE
Molecular Disclaimer: NEGATIVE
Molecular Disclaimer: NEGATIVE
Molecular Disclaimer: NORMAL
Trichomonas: NEGATIVE

## 2018-10-07 ENCOUNTER — Other Ambulatory Visit: Payer: Self-pay | Admitting: Obstetrics

## 2018-10-07 DIAGNOSIS — B9689 Other specified bacterial agents as the cause of diseases classified elsewhere: Secondary | ICD-10-CM

## 2018-10-07 LAB — CERVICOVAGINAL ANCILLARY ONLY
Chlamydia: NEGATIVE
Neisseria Gonorrhea: NEGATIVE

## 2018-10-07 MED ORDER — TINIDAZOLE 500 MG PO TABS: 1000.0000 mg | ORAL_TABLET | Freq: Every day | ORAL | 2 refills | Status: DC

## 2018-10-09 ENCOUNTER — Telehealth: Payer: Self-pay | Admitting: *Deleted

## 2018-10-09 NOTE — Telephone Encounter (Signed)
-----   Message from Shelly Bombard, MD sent at 10/07/2018 12:26 PM EDT ----- Tindamax Rx for BV

## 2018-10-11 ENCOUNTER — Telehealth: Payer: Self-pay

## 2018-10-11 NOTE — Telephone Encounter (Signed)
  Approval for PA completed and sent to [facility] 10/11/18 VV#[id number] EFFECTIVE THROUGH 10/08/2019

## 2018-10-13 ENCOUNTER — Inpatient Hospital Stay (HOSPITAL_COMMUNITY)
Admission: AD | Admit: 2018-10-13 | Discharge: 2018-10-13 | Disposition: A | Discharge status: Home / Self Care | Payer: Medicaid Other | Attending: Obstetrics & Gynecology | Admitting: Obstetrics & Gynecology

## 2018-10-13 ENCOUNTER — Other Ambulatory Visit: Payer: Self-pay

## 2018-10-13 ENCOUNTER — Encounter (HOSPITAL_COMMUNITY): Payer: Self-pay

## 2018-10-13 DIAGNOSIS — O26899 Other specified pregnancy related conditions, unspecified trimester: Secondary | ICD-10-CM

## 2018-10-13 DIAGNOSIS — B9689 Other specified bacterial agents as the cause of diseases classified elsewhere: Secondary | ICD-10-CM | POA: Insufficient documentation

## 2018-10-13 DIAGNOSIS — O98812 Other maternal infectious and parasitic diseases complicating pregnancy, second trimester: Secondary | ICD-10-CM

## 2018-10-13 DIAGNOSIS — Z3A19 19 weeks gestation of pregnancy: Secondary | ICD-10-CM

## 2018-10-13 DIAGNOSIS — N760 Acute vaginitis: Secondary | ICD-10-CM | POA: Diagnosis not present

## 2018-10-13 DIAGNOSIS — O26892 Other specified pregnancy related conditions, second trimester: Secondary | ICD-10-CM | POA: Diagnosis not present

## 2018-10-13 DIAGNOSIS — O23592 Infection of other part of genital tract in pregnancy, second trimester: Secondary | ICD-10-CM | POA: Diagnosis not present

## 2018-10-13 DIAGNOSIS — O209 Hemorrhage in early pregnancy, unspecified: Secondary | ICD-10-CM | POA: Diagnosis present

## 2018-10-13 DIAGNOSIS — B3731 Acute candidiasis of vulva and vagina: Secondary | ICD-10-CM

## 2018-10-13 DIAGNOSIS — R102 Pelvic and perineal pain: Secondary | ICD-10-CM | POA: Diagnosis not present

## 2018-10-13 DIAGNOSIS — B373 Candidiasis of vulva and vagina: Secondary | ICD-10-CM | POA: Diagnosis not present

## 2018-10-13 LAB — URINALYSIS, ROUTINE W REFLEX MICROSCOPIC
Bilirubin Urine: NEGATIVE
Glucose, UA: NEGATIVE mg/dL
Hgb urine dipstick: NEGATIVE
Ketones, ur: NEGATIVE mg/dL
Nitrite: NEGATIVE
Protein, ur: NEGATIVE mg/dL
Specific Gravity, Urine: 1.027 (ref 1.005–1.030)
pH: 6 (ref 5.0–8.0)

## 2018-10-13 LAB — WET PREP, GENITAL
Sperm: NONE SEEN
Trich, Wet Prep: NONE SEEN
Yeast Wet Prep HPF POC: NONE SEEN

## 2018-10-13 MED ORDER — COMFORT FIT MATERNITY SUPP LG MISC: 1.0000 [IU] | Freq: Every day | 0 refills | Status: DC

## 2018-10-13 MED ORDER — FLUCONAZOLE 150 MG PO TABS: 150.0000 mg | ORAL_TABLET | Freq: Every day | ORAL | 0 refills | Status: DC

## 2018-10-13 MED ORDER — ACETAMINOPHEN 325 MG PO TABS
650.0000 mg | ORAL_TABLET | Freq: Once | ORAL | Status: AC
  Administered 2018-10-13: 650 mg via ORAL
  Filled 2018-10-13: qty 2

## 2018-10-13 MED ORDER — METRONIDAZOLE 0.75 % VA GEL: 1.0000 | Freq: Every day | VAGINAL | 0 refills | Status: DC

## 2018-10-13 NOTE — MAU Provider Note (Signed)
History     CSN: [id number]  Arrival date and time: 10/13/18 1701   First Provider Initiated Contact with Patient 10/13/18 1732       Chief Complaint  Patient presents with  . Abdominal Pain  . Vaginal Bleeding   Patricia Koch is a 18 y.o. G2P1 at 13w6dby [place] present to MAU with complaints of abdominal pain and vaginal bleeding. She reports abdominal pain starting occurring for the past 2-3 days, describes abdominal pain as sharp pain that is worse with movement and standing, rates pain 4/10- has not taken any medication for abdominal pain. She reports vaginal bleeding started occurring yesterday. Reports it as spotting when she wipes. Was recently diagnosed with BV on 9/25 and picked up medication on 9/30 to begin taking. She denies recent IC or having to wear a pad or panty liner for vaginal bleeding. She receives prenatal care at [facility].   OB History    Gravida  2   Para  1   Term  1   Preterm      AB      Living  1     SAB      TAB      Ectopic      Multiple  0   Live Births  1           Past Medical History:  Diagnosis Date  . Anemia   . Headache   . Pes planus     Past Surgical History:  Procedure Laterality Date  . NOSE SURGERY    . WISDOM TOOTH EXTRACTION      Family History  Problem Relation Age of Onset  . Cancer Paternal Grandmother   . Miscarriages / [place]   . Cancer Maternal Grandmother   . Diabetes Maternal Grandmother   . Hypertension Maternal Grandmother     Social History   Tobacco Use  . Smoking status: Never Smoker  . Smokeless tobacco: Never Used  Substance Use Topics  . Alcohol use: Not Currently  . Drug use: Never    Allergies: No Known Allergies  Medications Prior to Admission  Medication Sig Dispense Refill Last Dose  . Blood Pressure Monitoring KIT 1 kit by Does not apply route once a week. (Patient not taking: Reported on 09/05/2018) 1 kit 0   . cephALEXin (KEFLEX) 500 MG capsule Take 1  capsule (500 mg total) by mouth 4 (four) times daily. (Patient not taking: Reported on 09/05/2018) 28 capsule 0   . Docosahexaenoic Acid (PRENATAL DHA PO) Take 1 tablet by mouth daily.     . Prenatal Vit-Fe Phos-FA-Omega (VITAFOL GUMMIES) 3.33-0.333-34.8 MG CHEW Chew 1 tablet by mouth daily. 90 tablet 4   . tinidazole (TINDAMAX) 500 MG tablet Take 2 tablets (1,000 mg total) by mouth daily with breakfast. 10 tablet 2     Review of Systems  Constitutional: Negative.   Respiratory: Negative.   Cardiovascular: Negative.   Gastrointestinal: Positive for abdominal pain. Negative for constipation, diarrhea, nausea and vomiting.  Genitourinary: Positive for vaginal bleeding. Negative for difficulty urinating, dysuria, frequency, hematuria, pelvic pain and urgency.  Musculoskeletal: Negative.   Neurological: Negative.    Physical Exam   Blood pressure 109/69, pulse 77, temperature 98.9 F (37.2 C), temperature source Oral, resp. rate 16, height _0  (1.575 m), weight 70.6 kg, last menstrual period 04/28/2018, SpO2 100 %, not currently breastfeeding.  Physical Exam  Nursing note and vitals reviewed. Constitutional: She is oriented to person, place,  and time. She appears well-developed and well-nourished. No distress.  Cardiovascular: Normal rate and regular rhythm.  Respiratory: Effort normal and breath sounds normal. No respiratory distress. She has no wheezes.  GI: Soft. There is no abdominal tenderness. There is no rebound and no guarding.  Gravid appropriate for gestational age  Genitourinary: Cervix exhibits no motion tenderness and no friability.    Vaginal discharge present.     No vaginal bleeding.  No bleeding in the vagina.    Genitourinary Comments: Pelvic exam: Cervix pink-ecotropic cervix, visually closed, without lesion, moderate amount of white creamy discharge- no vaginal bleeding, vaginal walls and external genitalia noted yellow curdy discharge between labias   Musculoskeletal:         General: No edema.  Neurological: She is alert and oriented to person, place, and time.  Psychiatric: She has a normal mood and affect. Her behavior is normal. Thought content normal.   Dilation: Closed Effacement (%): Thick Exam by:: Wende Bushy CNM  MAU Course  Procedures  MDM Orders Placed This Encounter  Procedures  . Wet prep, genital  . Urinalysis, Routine w reflex microscopic   Meds ordered this encounter  Medications  . acetaminophen (TYLENOL) tablet 650 mg   Labs reviewed:  Results for orders placed or performed during the hospital encounter of 10/13/18 (from the past 24 hour(s))  Wet prep, genital     Status: Abnormal   Collection Time: 10/13/18  5:44 PM  Result Value Ref Range   Yeast Wet Prep HPF POC NONE SEEN NONE SEEN   Trich, Wet Prep NONE SEEN NONE SEEN   Clue Cells Wet Prep HPF POC PRESENT (A) NONE SEEN   WBC, Wet Prep HPF POC FEW (A) NONE SEEN   Sperm NONE SEEN   Urinalysis, Routine w reflex microscopic     Status: Abnormal   Collection Time: 10/13/18  6:00 PM  Result Value Ref Range   Color, Urine YELLOW YELLOW   APPearance HAZY (A) CLEAR   Specific Gravity, Urine 1.027 1.005 - 1.030   pH 6.0 5.0 - 8.0   Glucose, UA NEGATIVE NEGATIVE mg/dL   Hgb urine dipstick NEGATIVE NEGATIVE   Bilirubin Urine NEGATIVE NEGATIVE   Ketones, ur NEGATIVE NEGATIVE mg/dL   Protein, ur NEGATIVE NEGATIVE mg/dL   Nitrite NEGATIVE NEGATIVE   Leukocytes,Ua SMALL (A) NEGATIVE   RBC / HPF 0-5 0 - 5 RBC/hpf   WBC, UA 0-5 0 - 5 WBC/hpf   Bacteria, UA RARE (A) NONE SEEN   Squamous Epithelial / LPF 0-5 0 - 5   Mucus PRESENT    Wet prep positive for clue cells, will also treat for yeast infection as seen on clinical examination  Patient reports pain is resolved after treatment in MAU on Tylenol   Discussed RLP with patient and use of maternity support belt. Medication for BV changed to metrogel and one dose for diflucan given to patient for after treatment of metrogel.  Discussed reasons to return to MAU. Follow up as scheduled for prenatal appointments. Pt stable at time of discharge.   Assessment and Plan   1. Pain of round ligament during pregnancy   2. Bacterial vaginosis   3. Vulvovaginal candidiasis   4. [redacted] weeks gestation of pregnancy    Discharge home Follow up as scheduled  Rx for maternity support belt given  Rx for Metrogel and Diflucan sent to pharmacy  Return to MAU as needed   [facility] Follow up.   Specialty: Obstetrics and Gynecology Why: Follow up as scheduled for prenatal appointments  Contact information: [address], [place] Follow up.   Why: Pick up maternity support belt from this location  Contact information: [place] [address]         Allergies as of 10/13/2018   No Known Allergies     Medication List    STOP taking these medications   tinidazole 500 MG tablet Commonly known as: Tindamax     TAKE these medications   Blood Pressure Monitoring Kit 1 kit by Does not apply route once a week.   cephALEXin 500 MG capsule Commonly known as: KEFLEX Take 1 capsule (500 mg total) by mouth 4 (four) times daily.   Comfort Fit Maternity Supp Lg Misc 1 Units by Does not apply route daily.   fluconazole 150 MG tablet Commonly known as: Diflucan Take 1 tablet (150 mg total) by mouth daily.   metroNIDAZOLE 0.75 % vaginal gel Commonly known as: METROGEL Place 1 Applicatorful vaginally at bedtime. Apply one applicatorful to vagina at bedtime for 5 days   PRENATAL DHA PO Take 1 tablet by mouth daily.   Vitafol Gummies 3.33-0.333-34.8 MG Chew Chew 1 tablet by mouth daily.      Lajean Manes CNM 10/13/2018, 6:47 PM

## 2018-10-13 NOTE — MAU Note (Signed)
Patricia Koch is a 18 y.o. at [redacted]w[redacted]d here in MAU reporting: sharp pains and her left side since Monday and states it has gotten worse. The pain is worse when she is standing. States yesterday and today she has seen a little bit of bleeding on the toilet paper when she wiped, only happened twice.   Onset of complaint: ongoing  Pain score: 6/10  Vitals:   10/13/18 1722  BP: 109/69  Pulse: 77  Resp: 16  Temp: 98.9 F (37.2 C)  SpO2: 100%     FHT:143  Lab orders placed from triage: UA

## 2018-10-17 ENCOUNTER — Other Ambulatory Visit: Payer: Self-pay

## 2018-10-17 ENCOUNTER — Ambulatory Visit (HOSPITAL_COMMUNITY)
Admission: RE | Admit: 2018-10-17 | Discharge: 2018-10-17 | Disposition: A | Discharge status: Home / Self Care | Payer: Medicaid Other | Source: Ambulatory Visit | Attending: Maternal & Fetal Medicine | Admitting: Maternal & Fetal Medicine

## 2018-10-17 DIAGNOSIS — O09892 Supervision of other high risk pregnancies, second trimester: Secondary | ICD-10-CM | POA: Diagnosis not present

## 2018-10-17 DIAGNOSIS — Z362 Encounter for other antenatal screening follow-up: Secondary | ICD-10-CM | POA: Diagnosis not present

## 2018-10-17 DIAGNOSIS — Z3A20 20 weeks gestation of pregnancy: Secondary | ICD-10-CM

## 2018-10-31 ENCOUNTER — Encounter: Payer: Self-pay | Admitting: Obstetrics

## 2018-10-31 ENCOUNTER — Other Ambulatory Visit: Payer: Self-pay

## 2018-10-31 ENCOUNTER — Telehealth (INDEPENDENT_AMBULATORY_CARE_PROVIDER_SITE_OTHER): Payer: Medicaid Other | Admitting: Obstetrics

## 2018-10-31 DIAGNOSIS — Z3A22 22 weeks gestation of pregnancy: Secondary | ICD-10-CM

## 2018-10-31 DIAGNOSIS — O99820 Streptococcus B carrier state complicating pregnancy: Secondary | ICD-10-CM

## 2018-10-31 DIAGNOSIS — O99891 Other specified diseases and conditions complicating pregnancy: Secondary | ICD-10-CM

## 2018-10-31 DIAGNOSIS — A749 Chlamydial infection, unspecified: Secondary | ICD-10-CM

## 2018-10-31 DIAGNOSIS — O0990 Supervision of high risk pregnancy, unspecified, unspecified trimester: Secondary | ICD-10-CM

## 2018-10-31 DIAGNOSIS — O09892 Supervision of other high risk pregnancies, second trimester: Secondary | ICD-10-CM

## 2018-10-31 DIAGNOSIS — R8271 Bacteriuria: Secondary | ICD-10-CM

## 2018-10-31 DIAGNOSIS — O98812 Other maternal infectious and parasitic diseases complicating pregnancy, second trimester: Secondary | ICD-10-CM

## 2018-10-31 DIAGNOSIS — O0992 Supervision of high risk pregnancy, unspecified, second trimester: Secondary | ICD-10-CM

## 2018-10-31 NOTE — Progress Notes (Signed)
TELEHEALTH OBSTETRICS PRENATAL VIRTUAL VIDEO VISIT ENCOUNTER NOTE  [facility]   I connected with Patricia KelpSadavia K Bara on 10/31/18 at 10:45 AM EDT by OB MyChart Video Encounter at home and verified that I am speaking with the correct person using two identifiers.   I discussed the limitations, risks, security and privacy concerns of performing an evaluation and management service virtually and the availability of in person appointments. I also discussed with the patient that there may be a patient responsible charge related to this service. The patient expressed understanding and agreed to proceed. Subjective:  Patricia Koch is a 18 y.o. G2P1001 at 167w3d being seen today for ongoing prenatal care.  She is currently monitored for the following issues for this high-risk pregnancy and has Pes planus; Supervision of other normal pregnancy, antepartum; Short interval between pregnancies affecting pregnancy in second trimester, antepartum; Chlamydia infection affecting pregnancy; and UTI (urinary tract infection) during pregnancy on their problem list.  Patient reports no complaints.  Contractions: Not present. Vag. Bleeding: None.  Movement: Present. Denies any leaking of fluid.   The following portions of the patient's history were reviewed and updated as appropriate: allergies, current medications, past family history, past medical history, past social history, past surgical history and problem list.   Objective:  There were no vitals filed for this visit.  Fetal Status:     Movement: Present     General:  Alert, oriented and cooperative. Patient is in no acute distress.  Respiratory: Normal respiratory effort, no problems with respiration noted  Mental Status: Normal mood and affect. Normal behavior. Normal judgment and thought content.  Rest of physical exam deferred due to type of encounter  Imaging: [place] Mfm Ob Follow Up  Result Date:  10/17/2018 ----------------------------------------------------------------------  OBSTETRICS REPORT                       (Signed Final 10/17/2018 12:23 pm) ---------------------------------------------------------------------- Patient Info  ID #:       [id number]                          D.O.B.:  Jul 11, 2000 (18 yrs)  Name:       Patricia Koch                 Visit Date: 10/17/2018 08:43 am ---------------------------------------------------------------------- Performed By  Performed By:     Birdena CrandallYasemin Karatas        Ref. Address:     [address]                    [address]                                                             [address]                                                             [address] [phone]  [place]                                                             [id number]  Attending:        Lin Landsman      [facility]                                       [facility]  Referred By:      [facility] ---------------------------------------------------------------------- Orders   #  Description                          Code         Ordered By   1  [place] MFM OB FOLLOW UP                  38250.53     Lin Landsman  ----------------------------------------------------------------------   #  Order #                    Accession #                 Episode #   1  [id number]                  [id number]                  [id number]  ---------------------------------------------------------------------- Indications   Encounter for uncertain dates                  Z36.87   Antenatal follow-up for nonvisualized fetal    Z36.2   anatomy   Short interval between pregancies, 2nd         O09.892   trimester   Teen pregnancy                                 O75.89   [redacted] weeks gestation of pregnancy                Z3A.20   ---------------------------------------------------------------------- Fetal Evaluation  Num Of Fetuses:         1  Fetal Heart Rate(bpm):  161  Cardiac Activity:       Observed  Presentation:           Breech, footling  Placenta:               Posterior  P. Cord Insertion:      Visualized, central  Amniotic Fluid  AFI FV:      Within normal limits  Largest Pocket(cm)                              4.18 ---------------------------------------------------------------------- Biometry  BPD:      47.3  mm     G. Age:  20w 2d         44  %    CI:         73.5   %    70 - 86                                                          FL/HC:      17.9   %    16.8 - 19.8  HC:      175.3  mm     G. Age:  20w 0d         25  %    HC/AC:      1.12        1.09 - 1.39  AC:      156.2  mm     G. Age:  20w 5d         56  %    FL/BPD:     66.4   %  FL:       31.4  mm     G. Age:  19w 5d         20  %    FL/AC:      20.1   %    20 - 24  Est. FW:     344  gm    0 lb 12 oz      37  % ---------------------------------------------------------------------- OB History  Gravidity:    2         Term:   1        Prem:   0        SAB:   0  TOP:          0       Ectopic:  0        Living: 1 ---------------------------------------------------------------------- Gestational Age  U/S Today:     20w 1d                                        EDD:   03/05/19  Best:          Cherylann Parr 3d     Det. By:  U/S  (09/12/18)          EDD:   03/03/19 ---------------------------------------------------------------------- Anatomy  Cranium:               Appears normal         LVOT:                   Appears normal  Cavum:                 Appears normal         Aortic Arch:            Appears normal  Ventricles:            Appears normal  Ductal Arch:            Appears normal  Choroid Plexus:        Appears normal         Diaphragm:              Appears normal  Cerebellum:            Appears normal         Stomach:                Appears  normal, left                                                                        sided  Posterior Fossa:       Appears normal         Abdomen:                Appears normal  Nuchal Fold:           Previously seen        Abdominal Wall:         Appears nml (cord                                                                        insert, abd wall)  Face:                  Orbits and profile     Cord Vessels:           Appears normal (3                         previously seen                                vessel cord)  Lips:                  Previously seen        Kidneys:                Appear normal  Palate:                Appears normal         Bladder:                Appears normal  Thoracic:              Appears normal         Spine:                  Appears normal  Heart:                 Appears normal         Upper Extremities:      Previously seen                         (  4CH, axis, and                         situs)  RVOT:                  Appears normal         Lower Extremities:      Appears normal  Other:  Fetus appears to be female. Heels visualized. Nasal bone visualized. ---------------------------------------------------------------------- Cervix Uterus Adnexa  Cervix  Length:            4.8  cm.  Normal appearance by transabdominal scan.  Uterus  No abnormality visualized. ---------------------------------------------------------------------- Impression  Normal interval growth.  No ultrasonic evidence of structural  fetal anomalies.  Anatomy complete ---------------------------------------------------------------------- Recommendations  Follow up as clincially indicated. ----------------------------------------------------------------------               Sander Nephew, MD Electronically Signed Final Report   10/17/2018 12:23 pm ----------------------------------------------------------------------   Assessment and Plan:  Pregnancy: G2P1001 at [redacted]w[redacted]d 1. Supervision of high risk pregnancy,  antepartum Rx: - Babyscripts Schedule Optimization - Enroll Patient in Babyscripts  2. Short interval between pregnancies affecting pregnancy in second trimester, antepartum  3. Asymptomatic bacteriuria during pregnancy in second trimester - urine culture at 28 weeks  4. Chlamydia infection affecting pregnancy in second trimester - TOC at 28 weeks  Preterm labor symptoms and general obstetric precautions including but not limited to vaginal bleeding, contractions, leaking of fluid and fetal movement were reviewed in detail with the patient. I discussed the assessment and treatment plan with the patient. The patient was provided an opportunity to ask questions and all were answered. The patient agreed with the plan and demonstrated an understanding of the instructions. The patient was advised to call back or seek an in-person office evaluation/go to [facility]U at [facility] for any urgent or concerning symptoms. Please refer to After Visit Summary for other counseling recommendations.   I provided 10 minutes of face-to-face time during this encounter.  No follow-ups on file.  No future appointments.  Baltazar Najjar, MD [facility] for [facility] 10/31/2018

## 2018-11-02 DIAGNOSIS — O339 Maternal care for disproportion, unspecified: Secondary | ICD-10-CM | POA: Diagnosis not present

## 2018-11-28 ENCOUNTER — Encounter: Payer: Self-pay | Admitting: Obstetrics and Gynecology

## 2018-11-28 ENCOUNTER — Telehealth (INDEPENDENT_AMBULATORY_CARE_PROVIDER_SITE_OTHER): Payer: Medicaid Other | Admitting: Obstetrics and Gynecology

## 2018-11-28 VITALS — BP 110/84 | HR 91

## 2018-11-28 DIAGNOSIS — O98812 Other maternal infectious and parasitic diseases complicating pregnancy, second trimester: Secondary | ICD-10-CM

## 2018-11-28 DIAGNOSIS — A749 Chlamydial infection, unspecified: Secondary | ICD-10-CM

## 2018-11-28 DIAGNOSIS — Z3480 Encounter for supervision of other normal pregnancy, unspecified trimester: Secondary | ICD-10-CM

## 2018-11-28 DIAGNOSIS — Z3A26 26 weeks gestation of pregnancy: Secondary | ICD-10-CM

## 2018-11-28 NOTE — Progress Notes (Signed)
   TELEHEALTH OBSTETRICS PRENATAL VIRTUAL VIDEO VISIT ENCOUNTER NOTE  Provider location: [facility] [place]   I connected with [name] on 11/28/18 at  9:45 AM EST by MyChart Video Encounter at home and verified that I am speaking with the correct person using two identifiers.   I discussed the limitations, risks, security and privacy concerns of performing an evaluation and management service virtually and the availability of in person appointments. I also discussed with the patient that there may be a patient responsible charge related to this service. The patient expressed understanding and agreed to proceed. Subjective:  Patricia Koch is a 18 y.o. G2P1001 at [redacted]w[redacted]d being seen today for ongoing prenatal care.  She is currently monitored for the following issues for this low-risk pregnancy and has Pes planus; Supervision of other normal pregnancy, antepartum; Short interval between pregnancies affecting pregnancy in second trimester, antepartum; Chlamydia infection affecting pregnancy; and UTI (urinary tract infection) during pregnancy on their problem list.  Patient reports no complaints.  Contractions: Not present. Vag. Bleeding: None.  Movement: Present. Denies any leaking of fluid.   The following portions of the patient's history were reviewed and updated as appropriate: allergies, current medications, past family history, past medical history, past social history, past surgical history and problem list.   Objective:   Vitals:   11/28/18 0924  BP: 110/84  Pulse: 91    Fetal Status:     Movement: Present     General:  Alert, oriented and cooperative. Patient is in no acute distress.  Respiratory: Normal respiratory effort, no problems with respiration noted  Mental Status: Normal mood and affect. Normal behavior. Normal judgment and thought content.  Rest of physical exam deferred due to type of encounter  Imaging: No results found.  Assessment and  Plan:  Pregnancy: G2P1001 at [redacted]w[redacted]d 1. Supervision of other normal pregnancy, antepartum Patient is doing well Third trimester labs next visit Reviewed normal anatomy ultrasound  2. Chlamydia infection affecting pregnancy in second trimester TOC negative in september  Preterm labor symptoms and general obstetric precautions including but not limited to vaginal bleeding, contractions, leaking of fluid and fetal movement were reviewed in detail with the patient. I discussed the assessment and treatment plan with the patient. The patient was provided an opportunity to ask questions and all were answered. The patient agreed with the plan and demonstrated an understanding of the instructions. The patient was advised to call back or seek an in-person office evaluation/go to [facility] at [facility] for any urgent or concerning symptoms. Please refer to After Visit Summary for other counseling recommendations.   I provided 11 minutes of face-to-face time during this encounter.  Return in about 2 weeks (around 12/12/2018) for in person, ROB, 2 hr glucola next visit.  Future Appointments  Date Time Provider [place]  11/28/2018  9:45 AM [place] Kretz, Vickii Chafe, MD [facility] None    Mora Bellman, MD [facility] [place]

## 2018-12-12 ENCOUNTER — Other Ambulatory Visit: Payer: Self-pay

## 2018-12-12 ENCOUNTER — Other Ambulatory Visit: Payer: Medicaid Other

## 2018-12-12 ENCOUNTER — Ambulatory Visit (INDEPENDENT_AMBULATORY_CARE_PROVIDER_SITE_OTHER): Payer: Medicaid Other | Admitting: Obstetrics & Gynecology

## 2018-12-12 ENCOUNTER — Encounter: Payer: Self-pay | Admitting: Obstetrics & Gynecology

## 2018-12-12 DIAGNOSIS — Z3A28 28 weeks gestation of pregnancy: Secondary | ICD-10-CM

## 2018-12-12 DIAGNOSIS — Z3483 Encounter for supervision of other normal pregnancy, third trimester: Secondary | ICD-10-CM

## 2018-12-12 DIAGNOSIS — Z3480 Encounter for supervision of other normal pregnancy, unspecified trimester: Secondary | ICD-10-CM

## 2018-12-12 NOTE — Progress Notes (Signed)
   PRENATAL VISIT NOTE  Subjective:  Patricia Koch is a 18 y.o. G2P1001 at [redacted]w[redacted]d being seen today for ongoing prenatal care.  She is currently monitored for the following issues for this low-risk pregnancy and has Pes planus; Supervision of other normal pregnancy, antepartum; Short interval between pregnancies affecting pregnancy in second trimester, antepartum; Chlamydia infection affecting pregnancy; and UTI (urinary tract infection) during pregnancy on their problem list.  Patient reports no complaints.  Contractions: Not present. Vag. Bleeding: None.  Movement: Present. Denies leaking of fluid.   The following portions of the patient's history were reviewed and updated as appropriate: allergies, current medications, past family history, past medical history, past social history, past surgical history and problem list.   Objective:   Vitals:   12/12/18 0909  BP: 136/76  Pulse: 94  Weight: 163 lb (73.9 kg)    Fetal Status: Fetal Heart Rate (bpm): 145   Movement: Present     General:  Alert, oriented and cooperative. Patient is in no acute distress.  Skin: Skin is warm and dry. No rash noted.   Cardiovascular: Normal heart rate noted  Respiratory: Normal respiratory effort, no problems with respiration noted  Abdomen: Soft, gravid, appropriate for gestational age.  Pain/Pressure: Absent     Pelvic: Cervical exam deferred        Extremities: Normal range of motion.     Mental Status: Normal mood and affect. Normal behavior. Normal judgment and thought content.   Assessment and Plan:  Pregnancy: G2P1001 at [redacted]w[redacted]d 1. Supervision of other normal pregnancy, antepartum Doing well, routine 28 week labs  Preterm labor symptoms and general obstetric precautions including but not limited to vaginal bleeding, contractions, leaking of fluid and fetal movement were reviewed in detail with the patient. Please refer to After Visit Summary for other counseling recommendations.   Return in  about 4 weeks (around 01/09/2019) for virtual.  No future appointments.  Emeterio Reeve, MD

## 2018-12-12 NOTE — Patient Instructions (Signed)

## 2018-12-13 LAB — CBC
Hematocrit: 31.1 % — ABNORMAL LOW (ref 34.0–46.6)
Hemoglobin: 10.1 g/dL — ABNORMAL LOW (ref 11.1–15.9)
MCH: 29.4 pg (ref 26.6–33.0)
MCHC: 32.5 g/dL (ref 31.5–35.7)
MCV: 91 fL (ref 79–97)
Platelets: 176 10*3/uL (ref 150–450)
RBC: 3.43 x10E6/uL — ABNORMAL LOW (ref 3.77–5.28)
RDW: 13.5 % (ref 11.7–15.4)
WBC: 9.8 10*3/uL (ref 3.4–10.8)

## 2018-12-13 LAB — HIV ANTIBODY (ROUTINE TESTING W REFLEX): HIV Screen 4th Generation wRfx: NONREACTIVE

## 2018-12-13 LAB — RPR: RPR Ser Ql: NONREACTIVE

## 2018-12-13 LAB — GLUCOSE TOLERANCE, 2 HOURS W/ 1HR
Glucose, 1 hour: 98 mg/dL (ref 65–179)
Glucose, 2 hour: 92 mg/dL (ref 65–152)
Glucose, Fasting: 64 mg/dL — ABNORMAL LOW (ref 65–91)

## 2018-12-23 ENCOUNTER — Other Ambulatory Visit: Payer: Self-pay

## 2018-12-23 ENCOUNTER — Encounter (HOSPITAL_COMMUNITY): Payer: Self-pay | Admitting: Family Medicine

## 2018-12-23 ENCOUNTER — Inpatient Hospital Stay (HOSPITAL_COMMUNITY)
Admission: AD | Admit: 2018-12-23 | Discharge: 2018-12-23 | Disposition: A | Discharge status: Home / Self Care | Payer: Medicaid Other | Source: Ambulatory Visit | Attending: Family Medicine | Admitting: Family Medicine

## 2018-12-23 DIAGNOSIS — R55 Syncope and collapse: Secondary | ICD-10-CM | POA: Insufficient documentation

## 2018-12-23 DIAGNOSIS — Z3A30 30 weeks gestation of pregnancy: Secondary | ICD-10-CM | POA: Diagnosis not present

## 2018-12-23 DIAGNOSIS — O26893 Other specified pregnancy related conditions, third trimester: Secondary | ICD-10-CM | POA: Insufficient documentation

## 2018-12-23 LAB — COMPREHENSIVE METABOLIC PANEL
ALT: 10 U/L (ref 0–44)
AST: 25 U/L (ref 15–41)
Albumin: 2.8 g/dL — ABNORMAL LOW (ref 3.5–5.0)
Alkaline Phosphatase: 92 U/L (ref 38–126)
Anion gap: 8 (ref 5–15)
BUN: 5 mg/dL — ABNORMAL LOW (ref 6–20)
CO2: 21 mmol/L — ABNORMAL LOW (ref 22–32)
Calcium: 8.6 mg/dL — ABNORMAL LOW (ref 8.9–10.3)
Chloride: 106 mmol/L (ref 98–111)
Creatinine, Ser: 0.75 mg/dL (ref 0.44–1.00)
GFR calc Af Amer: >60 mL/min (ref >60)
GFR calc non Af Amer: >60 mL/min (ref >60)
Glucose, Bld: 85 mg/dL (ref 70–99)
Potassium: 3.7 mmol/L (ref 3.5–5.1)
Sodium: 135 mmol/L (ref 135–145)
Total Bilirubin: 0.4 mg/dL (ref 0.3–1.2)
Total Protein: 6.8 g/dL (ref 6.5–8.1)

## 2018-12-23 LAB — URINALYSIS, ROUTINE W REFLEX MICROSCOPIC
Bilirubin Urine: NEGATIVE
Glucose, UA: NEGATIVE mg/dL
Hgb urine dipstick: NEGATIVE
Ketones, ur: NEGATIVE mg/dL
Nitrite: NEGATIVE
Protein, ur: NEGATIVE mg/dL
Specific Gravity, Urine: 1.011 (ref 1.005–1.030)
pH: 7 (ref 5.0–8.0)

## 2018-12-23 LAB — CBC
HCT: 31.9 % — ABNORMAL LOW (ref 36.0–46.0)
Hemoglobin: 10.4 g/dL — ABNORMAL LOW (ref 12.0–15.0)
MCH: 28.9 pg (ref 26.0–34.0)
MCHC: 32.6 g/dL (ref 30.0–36.0)
MCV: 88.6 fL (ref 80.0–100.0)
Platelets: 174 10*3/uL (ref 150–400)
RBC: 3.6 MIL/uL — ABNORMAL LOW (ref 3.87–5.11)
RDW: 13 % (ref 11.5–15.5)
WBC: 14.1 10*3/uL — ABNORMAL HIGH (ref 4.0–10.5)
nRBC: 0 % (ref 0.0–0.2)

## 2018-12-23 MED ORDER — LACTATED RINGERS IV BOLUS
1000.0000 mL | Freq: Once | INTRAVENOUS | Status: AC
  Administered 2018-12-23: 1000 mL via INTRAVENOUS

## 2018-12-23 NOTE — MAU Provider Note (Signed)
History     CSN: [id number]  Arrival date and time: 12/23/18 1750   First Provider Initiated Contact with Patient 12/23/18 1851      Chief Complaint  Patient presents with  . Loss of Consciousness   HPI Patricia Koch is a 18 y.o. G2P1001 at 5w0dwho presents after a fall. 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 She states she woke up on the floor. It was an unwitnessed episode and she is unsure how long she was unconscious. She is unsure if she hit her head or abdomen. She denies any abdominal pain, vaginal bleeding or leaking of fluid. Reports normal fetal movement. She reports drinking 2 bottles of water today and eating well. She states this has never happened before. She denies any symptoms of feeling flushed or lightheaded now.   [facility]   OB History    Gravida  2   Para  1   Term  1   Preterm      AB      Living  1     SAB      TAB      Ectopic      Multiple  0   Live Births  1           Past Medical History:  Diagnosis Date  . Anemia   . Headache   . Pes planus     Past Surgical History:  Procedure Laterality Date  . NOSE SURGERY    . WISDOM TOOTH EXTRACTION      Family History  Problem Relation Age of Onset  . Cancer Paternal Grandmother   . Miscarriages / [place]   . Cancer Maternal Grandmother   . Diabetes Maternal Grandmother   . Hypertension Maternal Grandmother     Social History   Tobacco Use  . Smoking status: Never Smoker  . Smokeless tobacco: Never Used  Substance Use Topics  . Alcohol use: Not Currently  . Drug use: Never    Allergies: No Known Allergies  Medications Prior to Admission  Medication Sig Dispense Refill Last Dose  . Blood Pressure Monitoring KIT 1 kit by Does not apply route once a week. (Patient not taking: Reported on 09/05/2018) 1 kit 0   . cephALEXin (KEFLEX) 500  MG capsule Take 1 capsule (500 mg total) by mouth 4 (four) times daily. (Patient not taking: Reported on 09/05/2018) 28 capsule 0   . Docosahexaenoic Acid (PRENATAL DHA PO) Take 1 tablet by mouth daily.     . Elastic Bandages & Supports (COMFORT FIT MATERNITY SUPP LG) MISC 1 Units by Does not apply route daily. 1 each 0   . fluconazole (DIFLUCAN) 150 MG tablet Take 1 tablet (150 mg total) by mouth daily. 1 tablet 0   . metroNIDAZOLE (METROGEL) 0.75 % vaginal gel Place 1 Applicatorful vaginally at bedtime. Apply one applicatorful to vagina at bedtime for 5 days (Patient not taking: Reported on 11/28/2018) 70 g 0   . Prenatal Vit-Fe Phos-FA-Omega (VITAFOL GUMMIES) 3.33-0.333-34.8 MG CHEW Chew 1 tablet by mouth daily. 90 tablet 4     Review of Systems  Constitutional: Negative.  Negative for fatigue and fever.  HENT: Negative.   Respiratory: Negative.  Negative for shortness of breath.   Cardiovascular: Negative.  Negative for chest pain.  Gastrointestinal: Negative.  Negative for abdominal pain,  constipation, diarrhea, nausea and vomiting.  Genitourinary: Negative.  Negative for dysuria, vaginal bleeding and vaginal discharge.  Neurological: Positive for syncope. Negative for dizziness and headaches.   Physical Exam   Blood pressure 108/64, pulse 87, temperature 98.5 F (36.9 C), temperature source Oral, resp. rate 16, height '5\' 2"'$  (1.575 m), weight 75.4 kg, last menstrual period 04/28/2018, SpO2 100 %, not currently breastfeeding.  Physical Exam  Nursing note and vitals reviewed. Constitutional: She is oriented to person, place, and time. She appears well-developed and well-nourished. No distress.  HENT:  Head: Normocephalic.  Eyes: Pupils are equal, round, and reactive to light.  Cardiovascular: Normal rate, regular rhythm and normal heart sounds.  Respiratory: Effort normal and breath sounds normal. No respiratory distress.  GI: Soft. Bowel sounds are normal. She exhibits no distension.  There is no abdominal tenderness.  Neurological: She is alert and oriented to person, place, and time. She has normal reflexes. She displays normal reflexes. No cranial nerve deficit. She exhibits normal muscle tone. Coordination normal.  Skin: Skin is warm and dry.  Psychiatric: She has a normal mood and affect. Her behavior is normal. Judgment and thought content normal.   Fetal Tracing:  Baseline: 140 Variability: moderate Accels: 15x15 Decels: none  Toco: ui with occasional uc's  Dilation: Closed Effacement (%): Thick Exam by:: Sharmon Revere, CNM  MAU Course  Procedures Results for orders placed or performed during the hospital encounter of 12/23/18 (from the past 24 hour(s))  Urinalysis, Routine w reflex microscopic     Status: Abnormal   Collection Time: 12/23/18  6:34 PM  Result Value Ref Range   Color, Urine YELLOW YELLOW   APPearance HAZY (A) CLEAR   Specific Gravity, Urine 1.011 1.005 - 1.030   pH 7.0 5.0 - 8.0   Glucose, UA NEGATIVE NEGATIVE mg/dL   Hgb urine dipstick NEGATIVE NEGATIVE   Bilirubin Urine NEGATIVE NEGATIVE   Ketones, ur NEGATIVE NEGATIVE mg/dL   Protein, ur NEGATIVE NEGATIVE mg/dL   Nitrite NEGATIVE NEGATIVE   Leukocytes,Ua MODERATE (A) NEGATIVE   RBC / HPF 0-5 0 - 5 RBC/hpf   WBC, UA 6-10 0 - 5 WBC/hpf   Bacteria, UA RARE (A) NONE SEEN   Squamous Epithelial / LPF 6-10 0 - 5   Mucus PRESENT   CBC     Status: Abnormal   Collection Time: 12/23/18  6:55 PM  Result Value Ref Range   WBC 14.1 (H) 4.0 - 10.5 K/uL   RBC 3.60 (L) 3.87 - 5.11 MIL/uL   Hemoglobin 10.4 (L) 12.0 - 15.0 g/dL   HCT 31.9 (L) 36.0 - 46.0 %   MCV 88.6 80.0 - 100.0 fL   MCH 28.9 26.0 - 34.0 pg   MCHC 32.6 30.0 - 36.0 g/dL   RDW 13.0 11.5 - 15.5 %   Platelets 174 150 - 400 K/uL   nRBC 0.0 0.0 - 0.2 %  Comprehensive metabolic panel     Status: Abnormal   Collection Time: 12/23/18  6:55 PM  Result Value Ref Range   Sodium 135 135 - 145 mmol/L   Potassium 3.7 3.5 - 5.1 mmol/L    Chloride 106 98 - 111 mmol/L   CO2 21 (L) 22 - 32 mmol/L   Glucose, Bld 85 70 - 99 mg/dL   BUN 5 (L) 6 - 20 mg/dL   Creatinine, Ser 0.75 0.44 - 1.00 mg/dL   Calcium 8.6 (L) 8.9 - 10.3 mg/dL   Total Protein 6.8 6.5 - 8.1 g/dL  Albumin 2.8 (L) 3.5 - 5.0 g/dL   AST 25 15 - 41 U/L   ALT 10 0 - 44 U/L   Alkaline Phosphatase 92 38 - 126 U/L   Total Bilirubin 0.4 0.3 - 1.2 mg/dL   GFR calc non Af Amer >60 >60 mL/min   GFR calc Af Amer >60 >60 mL/min   Anion gap 8 5 - 15   MDM UA CBC, CMP LR bolus  UC's resolved with IV fluids. Cervix closed. Patient reporting no pain.  Patient now asymptomatic, normal neuro exam. Discussed changing positions slowly and avoiding spending long periods of time on her feet. Emphasized importance of hydration in pregnancy and reviewed appropriate water intake.   Assessment and Plan   1. Syncope, unspecified syncope type   2. [redacted] weeks gestation of pregnancy    -Discharge home in stable condition -Syncope precautions discussed -Patient advised to follow-up with [facility] as scheduled for prenatal care -Patient may return to MAU as needed or if her condition were to change or worsen   Wende Mott CNM 12/23/2018, 8:17 PM

## 2018-12-23 NOTE — Discharge Instructions (Signed)

## 2018-12-23 NOTE — MAU Note (Signed)
Patricia Koch is a 18 y.o. at [redacted]w[redacted]d here in MAU reporting: was at work today and started feeling hot and SOB, she woke up on the floor. Is unsure how long she was on the floor. States she woke up on her back and no one witnessed the fall. Currently feels "out of it". DFM. No pain, bleeding, or LOF.  Onset of complaint: today  Pain score: 0/10  Vitals:   12/23/18 1824  BP: 108/64  Pulse: 87  Resp: 16  Temp: 98.5 F (36.9 C)  SpO2: 100%     FHT:161  Lab orders placed from triage: UA

## 2018-12-23 NOTE — MAU Provider Note (Signed)
Patient discharged home however states she has a question about some pain she has noticed here and there near her belly button. The pain at times is sharp. The pain is very mild. No fever, n/v.  She has had a full work up here in [employer].    GENERAL: Well-developed, well-nourished female in no acute distress.  LUNGS: Effort normal SKIN: Warm, dry and without erythema PSYCH: Normal mood and affect Abdomen: soft, non-tender on exam. Small umbilical hernia noted which is reducible without pain.    Lezlie Lye, NP 12/23/2018 8:56 PM

## 2018-12-25 LAB — CULTURE, OB URINE

## 2019-01-09 ENCOUNTER — Telehealth (INDEPENDENT_AMBULATORY_CARE_PROVIDER_SITE_OTHER): Payer: Medicaid Other | Admitting: Obstetrics & Gynecology

## 2019-01-09 DIAGNOSIS — Z3A32 32 weeks gestation of pregnancy: Secondary | ICD-10-CM

## 2019-01-09 DIAGNOSIS — O98812 Other maternal infectious and parasitic diseases complicating pregnancy, second trimester: Secondary | ICD-10-CM

## 2019-01-09 DIAGNOSIS — A749 Chlamydial infection, unspecified: Secondary | ICD-10-CM

## 2019-01-09 DIAGNOSIS — Z3480 Encounter for supervision of other normal pregnancy, unspecified trimester: Secondary | ICD-10-CM

## 2019-01-09 NOTE — Patient Instructions (Signed)

## 2019-01-09 NOTE — Progress Notes (Signed)
Pt presents for Mychart visit. Pt identified with 2 patient identifiers. Her bp today is 115/74. Pt has no concerns today.

## 2019-01-09 NOTE — Progress Notes (Signed)
   TELEHEALTH VIRTUAL OBSTETRICS VISIT ENCOUNTER NOTE  I connected with Patricia Koch on 01/09/19 at 10:15 AM EST by telephone at home and verified that I am speaking with the correct person using two identifiers.   I discussed the limitations, risks, security and privacy concerns of performing an evaluation and management service by telephone and the availability of in person appointments. I also discussed with the patient that there may be a patient responsible charge related to this service. The patient expressed understanding and agreed to proceed.  Subjective:  Patricia Koch is a 18 y.o. G2P1001 at [redacted]w[redacted]d being followed for ongoing prenatal care.  She is currently monitored for the following issues for this low-risk pregnancy and has Pes planus; Supervision of other normal pregnancy, antepartum; Short interval between pregnancies affecting pregnancy in second trimester, antepartum; Chlamydia infection affecting pregnancy; and UTI (urinary tract infection) during pregnancy on their problem list.  Patient reports no complaints. Reports fetal movement. Denies any contractions, bleeding or leaking of fluid.   The following portions of the patient's history were reviewed and updated as appropriate: allergies, current medications, past family history, past medical history, past social history, past surgical history and problem list.   Objective:   General:  Alert, oriented and cooperative.   Mental Status: Normal mood and affect perceived. Normal judgment and thought content.  Rest of physical exam deferred due to type of encounter  Assessment and Plan:  Pregnancy: G2P1001 at [redacted]w[redacted]d 1. Chlamydia infection affecting pregnancy in second trimester  Asked her to record weight 2. Supervision of other normal pregnancy, antepartum Doing well  Preterm labor symptoms and general obstetric precautions including but not limited to vaginal bleeding, contractions, leaking of fluid and fetal movement  were reviewed in detail with the patient.  I discussed the assessment and treatment plan with the patient. The patient was provided an opportunity to ask questions and all were answered. The patient agreed with the plan and demonstrated an understanding of the instructions. The patient was advised to call back or seek an in-person office evaluation/go to [facility]U at [facility] for any urgent or concerning symptoms. Please refer to After Visit Summary for other counseling recommendations.   I provided 10 minutes of non-face-to-face time during this encounter.  Return in about 2 weeks (around 01/23/2019) for virtual.  No future appointments.  Emeterio Reeve, MD Center for [place], [place]

## 2019-01-12 NOTE — L&D Delivery Note (Signed)
Delivery Note At 6:21 PM a viable female was delivered via Vaginal, Spontaneous (Presentation:  Left Occiput Anterior).  APGAR: 9, 9; weight 7 lb 7.2 oz (3379 g).   Placenta status: Spontaneous, Intact.  Cord: 3 vessels with the following complications: None.  Anesthesia: None Episiotomy: None Lacerations: 2nd degree Suture Repair: 3.0 vicryl Est. Blood Loss (mL): 50  Mom to postpartum.  Baby to Couplet care / Skin to Skin.  Rolm Bookbinder CNM 02/25/2019, 7:49 PM

## 2019-01-23 ENCOUNTER — Telehealth (INDEPENDENT_AMBULATORY_CARE_PROVIDER_SITE_OTHER): Payer: Medicaid Other | Admitting: Obstetrics

## 2019-01-23 ENCOUNTER — Encounter: Payer: Self-pay | Admitting: Obstetrics

## 2019-01-23 DIAGNOSIS — O0993 Supervision of high risk pregnancy, unspecified, third trimester: Secondary | ICD-10-CM

## 2019-01-23 DIAGNOSIS — O09893 Supervision of other high risk pregnancies, third trimester: Secondary | ICD-10-CM

## 2019-01-23 DIAGNOSIS — O09892 Supervision of other high risk pregnancies, second trimester: Secondary | ICD-10-CM

## 2019-01-23 DIAGNOSIS — O98813 Other maternal infectious and parasitic diseases complicating pregnancy, third trimester: Secondary | ICD-10-CM

## 2019-01-23 DIAGNOSIS — Z3A34 34 weeks gestation of pregnancy: Secondary | ICD-10-CM

## 2019-01-23 DIAGNOSIS — A749 Chlamydial infection, unspecified: Secondary | ICD-10-CM

## 2019-01-23 DIAGNOSIS — O0990 Supervision of high risk pregnancy, unspecified, unspecified trimester: Secondary | ICD-10-CM

## 2019-01-23 NOTE — Progress Notes (Signed)
Virtual Visit via Telephone Note  I connected with Patricia Koch on 01/23/19 at 10:30 AM EST by telephone and verified that I am speaking with the correct person using two identifiers.  ROB unable to check BP right now.  Pt c/o belly button pain.

## 2019-01-23 NOTE — Progress Notes (Signed)
   TELEHEALTH OBSTETRICS PRENATAL VIRTUAL VIDEO VISIT ENCOUNTER NOTE  Provider location: [facility] for [facility] [place]   I connected with Patricia Koch on 01/23/19 at 10:30 AM EST by OB MyChart Video Encounter at home and verified that I am speaking with the correct person using two identifiers.   I discussed the limitations, risks, security and privacy concerns of performing an evaluation and management service virtually and the availability of in person appointments. I also discussed with the patient that there may be a patient responsible charge related to this service. The patient expressed understanding and agreed to proceed. Subjective:  Patricia Koch is a 19 y.o. G2P1001 at [redacted]w[redacted]d being seen today for ongoing prenatal care.  She is currently monitored for the following issues for this high-risk pregnancy and has Pes planus; Supervision of other normal pregnancy, antepartum; Short interval between pregnancies affecting pregnancy in second trimester, antepartum; Chlamydia infection affecting pregnancy; and UTI (urinary tract infection) during pregnancy on their problem list.  Patient reports no complaints.  Contractions: Irritability. Vag. Bleeding: None.  Movement: Present. Denies any leaking of fluid.   The following portions of the patient's history were reviewed and updated as appropriate: allergies, current medications, past family history, past medical history, past social history, past surgical history and problem list.   Objective:  There were no vitals filed for this visit.  Fetal Status:     Movement: Present     General:  Alert, oriented and cooperative. Patient is in no acute distress.  Respiratory: Normal respiratory effort, no problems with respiration noted  Mental Status: Normal mood and affect. Normal behavior. Normal judgment and thought content.  Rest of physical exam deferred due to type of encounter  Imaging: No results found.  Assessment and Plan:    Pregnancy: G2P1001 at [redacted]w[redacted]d 1. Supervision of high risk pregnancy, antepartum  2. Short interval between pregnancies affecting pregnancy in second trimester, antepartum  3. Chlamydia infection affecting pregnancy in third trimester - TOC at 36 weeks   Preterm labor symptoms and general obstetric precautions including but not limited to vaginal bleeding, contractions, leaking of fluid and fetal movement were reviewed in detail with the patient. I discussed the assessment and treatment plan with the patient. The patient was provided an opportunity to ask questions and all were answered. The patient agreed with the plan and demonstrated an understanding of the instructions. The patient was advised to call back or seek an in-person office evaluation/go to [facility]U at [facility] for any urgent or concerning symptoms. Please refer to After Visit Summary for other counseling recommendations.   I provided 10 minutes of face-to-face time during this encounter.  Return in about 2 weeks (around 02/06/2019) for [facility].   Coral Ceo, MD [facility] for [facility], [facility] 01/23/2019

## 2019-02-06 ENCOUNTER — Encounter: Payer: Self-pay | Admitting: Obstetrics

## 2019-02-06 ENCOUNTER — Other Ambulatory Visit (HOSPITAL_COMMUNITY)
Admission: RE | Admit: 2019-02-06 | Discharge: 2019-02-06 | Disposition: A | Discharge status: Home / Self Care | Payer: Medicaid Other | Source: Ambulatory Visit | Attending: Obstetrics | Admitting: Obstetrics

## 2019-02-06 ENCOUNTER — Other Ambulatory Visit: Payer: Self-pay

## 2019-02-06 ENCOUNTER — Ambulatory Visit (INDEPENDENT_AMBULATORY_CARE_PROVIDER_SITE_OTHER): Payer: Medicaid Other | Admitting: Obstetrics

## 2019-02-06 VITALS — BP 116/70 | HR 84 | Wt 167.0 lb

## 2019-02-06 DIAGNOSIS — Z3A36 36 weeks gestation of pregnancy: Secondary | ICD-10-CM

## 2019-02-06 DIAGNOSIS — O0990 Supervision of high risk pregnancy, unspecified, unspecified trimester: Secondary | ICD-10-CM | POA: Insufficient documentation

## 2019-02-06 DIAGNOSIS — O09899 Supervision of other high risk pregnancies, unspecified trimester: Secondary | ICD-10-CM

## 2019-02-06 DIAGNOSIS — O09892 Supervision of other high risk pregnancies, second trimester: Secondary | ICD-10-CM

## 2019-02-06 NOTE — Progress Notes (Signed)
Subjective:  Patricia Koch is a 19 y.o. G2P1001 at [redacted]w[redacted]d being seen today for ongoing prenatal care.  She is currently monitored for the following issues for this high-risk pregnancy and has Pes planus; Supervision of other normal pregnancy, antepartum; Short interval between pregnancies affecting pregnancy in second trimester, antepartum; Chlamydia infection affecting pregnancy; and UTI (urinary tract infection) during pregnancy on their problem list.  Patient reports heartburn.  Contractions: Irregular. Vag. Bleeding: None.  Movement: Present. Denies leaking of fluid.   The following portions of the patient's history were reviewed and updated as appropriate: allergies, current medications, past family history, past medical history, past social history, past surgical history and problem list. Problem list updated.  Objective:   Vitals:   02/06/19 1029  BP: 116/70  Pulse: 84  Weight: 167 lb (75.8 kg)    Fetal Status:     Movement: Present     General:  Alert, oriented and cooperative. Patient is in no acute distress.  Skin: Skin is warm and dry. No rash noted.   Cardiovascular: Normal heart rate noted  Respiratory: Normal respiratory effort, no problems with respiration noted  Abdomen: Soft, gravid, appropriate for gestational age. Pain/Pressure: Present     Pelvic:  Cervical exam deferred        Extremities: Normal range of motion.  Edema: Trace  Mental Status: Normal mood and affect. Normal behavior. Normal judgment and thought content.   Urinalysis:      Assessment and Plan:  Pregnancy: G2P1001 at [redacted]w[redacted]d  1. Supervision of high risk pregnancy, antepartum Rx: - Culture, beta strep (group b only) - Cervicovaginal ancillary only( [place])  2. Short interval between pregnancies affecting pregnancy, antepartum   Preterm labor symptoms and general obstetric precautions including but not limited to vaginal bleeding, contractions, leaking of fluid and fetal movement were  reviewed in detail with the patient. Please refer to After Visit Summary for other counseling recommendations.   Return in about 1 week (around 02/13/2019) for MyChart.   Brock Bad, MD  02/06/2019

## 2019-02-07 LAB — CERVICOVAGINAL ANCILLARY ONLY
Bacterial Vaginitis (gardnerella): POSITIVE — AB
Candida Glabrata: NEGATIVE
Candida Vaginitis: NEGATIVE
Chlamydia: POSITIVE — AB
Comment: NEGATIVE
Comment: NEGATIVE
Comment: NEGATIVE
Comment: NEGATIVE
Comment: NEGATIVE
Comment: NORMAL
Neisseria Gonorrhea: NEGATIVE
Trichomonas: NEGATIVE

## 2019-02-08 ENCOUNTER — Other Ambulatory Visit: Payer: Self-pay | Admitting: Obstetrics

## 2019-02-08 DIAGNOSIS — A749 Chlamydial infection, unspecified: Secondary | ICD-10-CM

## 2019-02-08 DIAGNOSIS — N760 Acute vaginitis: Secondary | ICD-10-CM

## 2019-02-08 DIAGNOSIS — B9689 Other specified bacterial agents as the cause of diseases classified elsewhere: Secondary | ICD-10-CM

## 2019-02-08 MED ORDER — SOLOSEC 2 G PO PACK: 1.0000 | PACK | Freq: Once | ORAL | 2 refills | Status: AC

## 2019-02-08 MED ORDER — CEFIXIME 400 MG PO CAPS: 400.0000 mg | ORAL_CAPSULE | Freq: Once | ORAL | 0 refills | Status: AC

## 2019-02-08 MED ORDER — AZITHROMYCIN 500 MG PO TABS: 1000.0000 mg | ORAL_TABLET | Freq: Once | ORAL | 0 refills | Status: AC

## 2019-02-10 LAB — CULTURE, BETA STREP (GROUP B ONLY): Strep Gp B Culture: NEGATIVE

## 2019-02-13 ENCOUNTER — Telehealth (INDEPENDENT_AMBULATORY_CARE_PROVIDER_SITE_OTHER): Payer: Medicaid Other | Admitting: Obstetrics

## 2019-02-13 ENCOUNTER — Encounter: Payer: Self-pay | Admitting: Obstetrics

## 2019-02-13 VITALS — BP 135/81 | HR 113

## 2019-02-13 DIAGNOSIS — Z3A37 37 weeks gestation of pregnancy: Secondary | ICD-10-CM

## 2019-02-13 DIAGNOSIS — A749 Chlamydial infection, unspecified: Secondary | ICD-10-CM

## 2019-02-13 DIAGNOSIS — Z3480 Encounter for supervision of other normal pregnancy, unspecified trimester: Secondary | ICD-10-CM

## 2019-02-13 DIAGNOSIS — O98813 Other maternal infectious and parasitic diseases complicating pregnancy, third trimester: Secondary | ICD-10-CM

## 2019-02-13 NOTE — Progress Notes (Signed)
I connected with Patricia Koch on 02/13/19 at 11:00 AM EST by telephone and verified that I am speaking with the correct person using two identifiers.  Pt c/o ctx's worst at night every 10 mins.  Pt will pick up Chlamydia rx today

## 2019-02-13 NOTE — Progress Notes (Signed)
   TELEHEALTH OBSTETRICS PRENATAL VIRTUAL VIDEO VISIT ENCOUNTER NOTE  Provider location: [facility] for [facility] [place]   I connected with Patricia Koch on 02/13/19 at 11:00 AM EST by OB MyChart Video Encounter at home and verified that I am speaking with the correct person using two identifiers.   I discussed the limitations, risks, security and privacy concerns of performing an evaluation and management service virtually and the availability of in person appointments. I also discussed with the patient that there may be a patient responsible charge related to this service. The patient expressed understanding and agreed to proceed. Subjective:  Patricia Koch is a 19 y.o. G2P1001 at [redacted]w[redacted]d being seen today for ongoing prenatal care.  She is currently monitored for the following issues for this low-risk pregnancy and has Pes planus; Supervision of other normal pregnancy, antepartum; Short interval between pregnancies affecting pregnancy in second trimester, antepartum; Chlamydia infection affecting pregnancy; and UTI (urinary tract infection) during pregnancy on their problem list.  Patient reports no complaints.  Contractions: Irregular. Vag. Bleeding: None.  Movement: Present. Denies any leaking of fluid.   The following portions of the patient's history were reviewed and updated as appropriate: allergies, current medications, past family history, past medical history, past social history, past surgical history and problem list.   Objective:   Vitals:   02/13/19 1107  BP: 135/81  Pulse: (!) 113    Fetal Status:     Movement: Present     General:  Alert, oriented and cooperative. Patient is in no acute distress.  Respiratory: Normal respiratory effort, no problems with respiration noted  Mental Status: Normal mood and affect. Normal behavior. Normal judgment and thought content.  Rest of physical exam deferred due to type of encounter  Imaging: No results  found.  Assessment and Plan:  Pregnancy: G2P1001 at [redacted]w[redacted]d 1. Supervision of other normal pregnancy, antepartum  2. Chlamydia infection affecting pregnancy in third trimester   Term labor symptoms and general obstetric precautions including but not limited to vaginal bleeding, contractions, leaking of fluid and fetal movement were reviewed in detail with the patient. I discussed the assessment and treatment plan with the patient. The patient was provided an opportunity to ask questions and all were answered. The patient agreed with the plan and demonstrated an understanding of the instructions. The patient was advised to call back or seek an in-person office evaluation/go to MAU at [facility] for any urgent or concerning symptoms. Please refer to After Visit Summary for other counseling recommendations.   I provided 10 minutes of face-to-face time during this encounter.  Return in about 1 week (around 02/20/2019) for MyChart.    Coral Ceo, MD Center for [facility], [facility] 02/13/2019

## 2019-02-20 ENCOUNTER — Telehealth (INDEPENDENT_AMBULATORY_CARE_PROVIDER_SITE_OTHER): Payer: Medicaid Other | Admitting: Obstetrics

## 2019-02-20 ENCOUNTER — Encounter: Payer: Self-pay | Admitting: Obstetrics

## 2019-02-20 VITALS — BP 129/82 | HR 115

## 2019-02-20 DIAGNOSIS — A749 Chlamydial infection, unspecified: Secondary | ICD-10-CM

## 2019-02-20 DIAGNOSIS — Z3A38 38 weeks gestation of pregnancy: Secondary | ICD-10-CM

## 2019-02-20 DIAGNOSIS — Z3480 Encounter for supervision of other normal pregnancy, unspecified trimester: Secondary | ICD-10-CM

## 2019-02-20 DIAGNOSIS — O98813 Other maternal infectious and parasitic diseases complicating pregnancy, third trimester: Secondary | ICD-10-CM

## 2019-02-20 NOTE — Progress Notes (Signed)
   TELEHEALTH OBSTETRICS PRENATAL VIRTUAL VIDEO VISIT ENCOUNTER NOTE  Provider location: [facility] for [facility] [place]   I connected with Patricia Koch on 02/20/19 at 10:45 AM EST by OB MyChart Video Encounter at home and verified that I am speaking with the correct person using two identifiers.   I discussed the limitations, risks, security and privacy concerns of performing an evaluation and management service virtually and the availability of in person appointments. I also discussed with the patient that there may be a patient responsible charge related to this service. The patient expressed understanding and agreed to proceed. Subjective:  Patricia Koch is a 19 y.o. G2P1001 at [redacted]w[redacted]d being seen today for ongoing prenatal care.  She is currently monitored for the following issues for this low-risk pregnancy and has Pes planus; Supervision of other normal pregnancy, antepartum; Short interval between pregnancies affecting pregnancy in second trimester, antepartum; Chlamydia infection affecting pregnancy; and UTI (urinary tract infection) during pregnancy on their problem list.  Patient reports no complaints.  Contractions: Irregular. Vag. Bleeding: None.  Movement: Present. Denies any leaking of fluid.   The following portions of the patient's history were reviewed and updated as appropriate: allergies, current medications, past family history, past medical history, past social history, past surgical history and problem list.   Objective:   Vitals:   02/20/19 1045  BP: 129/82  Pulse: (!) 115    Fetal Status:     Movement: Present     General:  Alert, oriented and cooperative. Patient is in no acute distress.  Respiratory: Normal respiratory effort, no problems with respiration noted  Mental Status: Normal mood and affect. Normal behavior. Normal judgment and thought content.  Rest of physical exam deferred due to type of encounter  Imaging: No results  found.  Assessment and Plan:  Pregnancy: G2P1001 at [redacted]w[redacted]d  1. Supervision of other normal pregnancy, antepartum  2. Chlamydia infection affecting pregnancy in third trimester   Term labor symptoms and general obstetric precautions including but not limited to vaginal bleeding, contractions, leaking of fluid and fetal movement were reviewed in detail with the patient. I discussed the assessment and treatment plan with the patient. The patient was provided an opportunity to ask questions and all were answered. The patient agreed with the plan and demonstrated an understanding of the instructions. The patient was advised to call back or seek an in-person office evaluation/go to MAU at [facility] for any urgent or concerning symptoms. Please refer to After Visit Summary for other counseling recommendations.   I provided 10 minutes of face-to-face time during this encounter.  Return in about 1 week (around 02/27/2019) for ROB.    Coral Ceo, MD Center for [facility], [facility] 02/20/2019

## 2019-02-20 NOTE — Progress Notes (Signed)
Pt is on the phone preparing for virtual visit with provider. [redacted]w[redacted]d. Pt was treated for chlamydia on 02/06/19. Needs TOC

## 2019-02-21 ENCOUNTER — Other Ambulatory Visit (HOSPITAL_COMMUNITY)
Admission: RE | Admit: 2019-02-21 | Discharge: 2019-02-21 | Disposition: A | Discharge status: Home / Self Care | Payer: Medicaid Other | Source: Ambulatory Visit | Attending: Obstetrics | Admitting: Obstetrics

## 2019-02-21 ENCOUNTER — Ambulatory Visit (INDEPENDENT_AMBULATORY_CARE_PROVIDER_SITE_OTHER): Payer: Medicaid Other

## 2019-02-21 ENCOUNTER — Other Ambulatory Visit: Payer: Self-pay

## 2019-02-21 DIAGNOSIS — A749 Chlamydial infection, unspecified: Secondary | ICD-10-CM | POA: Insufficient documentation

## 2019-02-21 MED ORDER — AZITHROMYCIN 250 MG PO TABS
1000.0000 mg | ORAL_TABLET | Freq: Once | ORAL | Status: AC
  Administered 2019-02-21: 1000 mg via ORAL

## 2019-02-21 NOTE — Progress Notes (Signed)
Pt is here for STI treatment. Pt tested positive for chlamydia on 02/06/19. While in the office she did a selfswab. Pt tolerated treatment well. -EH/RMA

## 2019-02-22 ENCOUNTER — Other Ambulatory Visit: Payer: Self-pay | Admitting: Obstetrics & Gynecology

## 2019-02-22 DIAGNOSIS — A749 Chlamydial infection, unspecified: Secondary | ICD-10-CM

## 2019-02-22 LAB — CERVICOVAGINAL ANCILLARY ONLY
Bacterial Vaginitis (gardnerella): NEGATIVE
Candida Glabrata: NEGATIVE
Candida Vaginitis: NEGATIVE
Chlamydia: POSITIVE — AB
Comment: NEGATIVE
Comment: NEGATIVE
Comment: NEGATIVE
Comment: NEGATIVE
Comment: NEGATIVE
Comment: NORMAL
Neisseria Gonorrhea: NEGATIVE
Trichomonas: NEGATIVE

## 2019-02-22 MED ORDER — AZITHROMYCIN 500 MG PO TABS: 1000.0000 mg | ORAL_TABLET | Freq: Once | ORAL | 1 refills | Status: AC

## 2019-02-22 NOTE — Progress Notes (Signed)
Azithromycin 1000 mg for chlamydia

## 2019-02-25 ENCOUNTER — Other Ambulatory Visit: Payer: Self-pay

## 2019-02-25 ENCOUNTER — Encounter (HOSPITAL_COMMUNITY): Payer: Self-pay | Admitting: Obstetrics and Gynecology

## 2019-02-25 ENCOUNTER — Inpatient Hospital Stay (EMERGENCY_DEPARTMENT_HOSPITAL)
Admission: AD | Admit: 2019-02-25 | Discharge: 2019-02-25 | Disposition: A | Discharge status: Home / Self Care | Payer: Medicaid Other | Source: Home / Self Care | Attending: Obstetrics and Gynecology | Admitting: Obstetrics and Gynecology

## 2019-02-25 ENCOUNTER — Encounter (HOSPITAL_COMMUNITY): Payer: Self-pay | Admitting: Obstetrics & Gynecology

## 2019-02-25 ENCOUNTER — Inpatient Hospital Stay (HOSPITAL_COMMUNITY)
Admission: AD | Admit: 2019-02-25 | Discharge: 2019-02-27 | DRG: 807 | Disposition: A | Discharge status: Home / Self Care | Payer: Medicaid Other | Attending: Obstetrics & Gynecology | Admitting: Obstetrics & Gynecology

## 2019-02-25 DIAGNOSIS — O471 False labor at or after 37 completed weeks of gestation: Secondary | ICD-10-CM | POA: Diagnosis not present

## 2019-02-25 DIAGNOSIS — Z30430 Encounter for insertion of intrauterine contraceptive device: Secondary | ICD-10-CM | POA: Diagnosis not present

## 2019-02-25 DIAGNOSIS — Z20822 Contact with and (suspected) exposure to covid-19: Secondary | ICD-10-CM | POA: Diagnosis present

## 2019-02-25 DIAGNOSIS — O479 False labor, unspecified: Secondary | ICD-10-CM

## 2019-02-25 DIAGNOSIS — Z3480 Encounter for supervision of other normal pregnancy, unspecified trimester: Secondary | ICD-10-CM

## 2019-02-25 DIAGNOSIS — Z3A39 39 weeks gestation of pregnancy: Secondary | ICD-10-CM

## 2019-02-25 LAB — RESPIRATORY PANEL BY RT PCR (FLU A&B, COVID)
Influenza A by PCR: NEGATIVE
Influenza B by PCR: NEGATIVE
SARS Coronavirus 2 by RT PCR: NEGATIVE

## 2019-02-25 LAB — CBC
HCT: 34 % — ABNORMAL LOW (ref 36.0–46.0)
Hemoglobin: 10.5 g/dL — ABNORMAL LOW (ref 12.0–15.0)
MCH: 25.6 pg — ABNORMAL LOW (ref 26.0–34.0)
MCHC: 30.9 g/dL (ref 30.0–36.0)
MCV: 82.9 fL (ref 80.0–100.0)
Platelets: 190 10*3/uL (ref 150–400)
RBC: 4.1 MIL/uL (ref 3.87–5.11)
RDW: 14.6 % (ref 11.5–15.5)
WBC: 13.5 10*3/uL — ABNORMAL HIGH (ref 4.0–10.5)
nRBC: 0 % (ref 0.0–0.2)

## 2019-02-25 LAB — ABO/RH: ABO/RH(D): A POS

## 2019-02-25 MED ORDER — DIBUCAINE (PERIANAL) 1 % EX OINT
1.0000 "application " | TOPICAL_OINTMENT | CUTANEOUS | Status: DC | PRN
  Administered 2019-02-26: 1 via RECTAL
  Filled 2019-02-25: qty 28

## 2019-02-25 MED ORDER — WITCH HAZEL-GLYCERIN EX PADS
1.0000 "application " | MEDICATED_PAD | CUTANEOUS | Status: DC | PRN
  Administered 2019-02-26: 1 via TOPICAL

## 2019-02-25 MED ORDER — ZOLPIDEM TARTRATE 5 MG PO TABS: 5.0000 mg | ORAL_TABLET | Freq: Every evening | ORAL | Status: DC | PRN

## 2019-02-25 MED ORDER — ONDANSETRON HCL 4 MG PO TABS: 4.0000 mg | ORAL_TABLET | ORAL | Status: DC | PRN

## 2019-02-25 MED ORDER — IBUPROFEN 600 MG PO TABS
600.0000 mg | ORAL_TABLET | Freq: Four times a day (QID) | ORAL | Status: DC
  Administered 2019-02-26 – 2019-02-27 (×7): 600 mg via ORAL
  Filled 2019-02-25 (×7): qty 1

## 2019-02-25 MED ORDER — SOD CITRATE-CITRIC ACID 500-334 MG/5ML PO SOLN: 30.0000 mL | ORAL | Status: DC | PRN

## 2019-02-25 MED ORDER — OXYCODONE HCL 5 MG PO TABS: 10.0000 mg | ORAL_TABLET | ORAL | Status: DC | PRN

## 2019-02-25 MED ORDER — ACETAMINOPHEN 325 MG PO TABS: 650.0000 mg | ORAL_TABLET | ORAL | Status: DC | PRN

## 2019-02-25 MED ORDER — SENNOSIDES-DOCUSATE SODIUM 8.6-50 MG PO TABS
2.0000 | ORAL_TABLET | ORAL | Status: DC
  Administered 2019-02-26 (×2): 2 via ORAL
  Filled 2019-02-25 (×2): qty 2

## 2019-02-25 MED ORDER — OXYCODONE-ACETAMINOPHEN 5-325 MG PO TABS: 2.0000 | ORAL_TABLET | ORAL | Status: DC | PRN

## 2019-02-25 MED ORDER — ONDANSETRON HCL 4 MG/2ML IJ SOLN: 4.0000 mg | INTRAMUSCULAR | Status: DC | PRN

## 2019-02-25 MED ORDER — ONDANSETRON HCL 4 MG/2ML IJ SOLN: 4.0000 mg | Freq: Four times a day (QID) | INTRAMUSCULAR | Status: DC | PRN

## 2019-02-25 MED ORDER — LEVONORGESTREL 19.5 MCG/DAY IU IUD
INTRAUTERINE_SYSTEM | Freq: Once | INTRAUTERINE | Status: AC
  Administered 2019-02-25: 1 via INTRAUTERINE
  Filled 2019-02-25: qty 1

## 2019-02-25 MED ORDER — OXYCODONE-ACETAMINOPHEN 5-325 MG PO TABS: 1.0000 | ORAL_TABLET | ORAL | Status: DC | PRN

## 2019-02-25 MED ORDER — OXYCODONE HCL 5 MG PO TABS: 5.0000 mg | ORAL_TABLET | ORAL | Status: DC | PRN

## 2019-02-25 MED ORDER — FENTANYL CITRATE (PF) 100 MCG/2ML IJ SOLN: 100.0000 ug | Freq: Once | INTRAMUSCULAR | Status: AC

## 2019-02-25 MED ORDER — TETANUS-DIPHTH-ACELL PERTUSSIS 5-2.5-18.5 LF-MCG/0.5 IM SUSP: 0.5000 mL | Freq: Once | INTRAMUSCULAR | Status: DC

## 2019-02-25 MED ORDER — COCONUT OIL OIL: 1.0000 "application " | TOPICAL_OIL | Status: DC | PRN

## 2019-02-25 MED ORDER — DIPHENHYDRAMINE HCL 25 MG PO CAPS: 25.0000 mg | ORAL_CAPSULE | Freq: Four times a day (QID) | ORAL | Status: DC | PRN

## 2019-02-25 MED ORDER — FENTANYL CITRATE (PF) 100 MCG/2ML IJ SOLN
INTRAMUSCULAR | Status: AC
  Administered 2019-02-25: 100 ug
  Filled 2019-02-25: qty 2

## 2019-02-25 MED ORDER — OXYTOCIN 40 UNITS IN NORMAL SALINE INFUSION - SIMPLE MED
2.5000 [IU]/h | INTRAVENOUS | Status: DC
  Filled 2019-02-25: qty 1000

## 2019-02-25 MED ORDER — LACTATED RINGERS IV SOLN: 500.0000 mL | INTRAVENOUS | Status: DC | PRN

## 2019-02-25 MED ORDER — PRENATAL MULTIVITAMIN CH
1.0000 | ORAL_TABLET | Freq: Every day | ORAL | Status: DC
  Administered 2019-02-26 – 2019-02-27 (×2): 1 via ORAL
  Filled 2019-02-25 (×2): qty 1

## 2019-02-25 MED ORDER — BENZOCAINE-MENTHOL 20-0.5 % EX AERO
1.0000 "application " | INHALATION_SPRAY | CUTANEOUS | Status: DC | PRN
  Administered 2019-02-26: 1 via TOPICAL
  Filled 2019-02-25: qty 56

## 2019-02-25 MED ORDER — OXYTOCIN BOLUS FROM INFUSION
500.0000 mL | Freq: Once | INTRAVENOUS | Status: AC
  Administered 2019-02-25: 500 mL via INTRAVENOUS

## 2019-02-25 MED ORDER — LACTATED RINGERS IV SOLN: INTRAVENOUS | Status: DC

## 2019-02-25 MED ORDER — SIMETHICONE 80 MG PO CHEW: 80.0000 mg | CHEWABLE_TABLET | ORAL | Status: DC | PRN

## 2019-02-25 MED ORDER — LIDOCAINE HCL (PF) 1 % IJ SOLN
30.0000 mL | INTRAMUSCULAR | Status: AC | PRN
  Administered 2019-02-25: 30 mL via SUBCUTANEOUS
  Filled 2019-02-25: qty 30

## 2019-02-25 NOTE — H&P (Addendum)
OBSTETRIC ADMISSION HISTORY AND PHYSICAL  Zenith MARLISSA EMERICK is a 19 y.o. female G2P1001 with IUP at 12w1dby 15wk [place] for SOL and NRFHT. She reports +FMs, No LOF, no VB, no blurry vision, headaches or peripheral edema, and RUQ pain.  She plans on breast and bottle feeding. She requests PP IUD for birth control. [facility] Dating: By 15 wk [place]--->  Estimated Date of Delivery: 03/03/19  Sono:    '@[redacted]w[redacted]d'$ , established dating, limited scan by dates, breech presentation, posterior placental lie, 121g, 32% EFW '@[redacted]w[redacted]d'$ , CWD, normal anatomy, footling breech presentation, posterior placental lie, 344g, 37% EFW  Prenatal History/Complications: Teen pregnancy Short interval between pregnancies, G1 08/2017 Chlamydia, TOC pending, positive 02/06/19, 02/21/19 UTI, TOC 12/2018  Past Medical History: Past Medical History:  Diagnosis Date   Anemia    Headache    Pes planus     Past Surgical History: Past Surgical History:  Procedure Laterality Date   NOSE SURGERY     WISDOM TOOTH EXTRACTION      Obstetrical History: OB History     Gravida  2   Para  1   Term  1   Preterm      AB      Living  1      SAB      TAB      Ectopic      Multiple  0   Live Births  1           Social History: Social History   Socioeconomic History   Marital status: Single    Spouse name: Not on file   Number of children: Not on file   Years of education: Not on file   Highest education level: Not on file  Occupational History    Employer: [employer]  Tobacco Use   Smoking status: Never Smoker   Smokeless tobacco: Never Used  Substance and Sexual Activity   Alcohol use: Not Currently   Drug use: Never   Sexual activity: Yes  Other Topics Concern   Not on file  Social History Narrative   Not on file   Social Determinants of Health   Financial Resource Strain:    Difficulty of Paying Living Expenses: Not on file  Food  Insecurity:    Worried About RCharity fundraiserin the Last Year: Not on file   RYRC Worldwideof Food in the Last Year: Not on file  Transportation Needs:    Lack of Transportation (Medical): Not on file   Lack of Transportation (Non-Medical): Not on file  Physical Activity:    Days of Exercise per Week: Not on file   Minutes of Exercise per Session: Not on file  Stress:    Feeling of Stress : Not on file  Social Connections:    Frequency of Communication with Friends and Family: Not on file   Frequency of Social Gatherings with Friends and Family: Not on file   Attends Religious Services: Not on file   Active Member of Clubs or Organizations: Not on file   Attends CArchivistMeetings: Not on file   Marital Status: Not on file    Family History: Family History  Problem Relation Age of Onset   Cancer Paternal Grandmother    Miscarriages / [place]oreaMother    Cancer Maternal Grandmother    Diabetes Maternal Grandmother    Hypertension Maternal Grandmother     Allergies:  No Known Allergies  Medications Prior to Admission  Medication Sig Dispense Refill Last Dose   Prenatal Vit-Fe Phos-FA-Omega (VITAFOL GUMMIES) 3.33-0.333-34.8 MG CHEW Chew 1 tablet by mouth daily. 90 tablet 4 02/24/2019 at Unknown time   Blood Pressure Monitoring KIT 1 kit by Does not apply route once a week. (Patient not taking: Reported on 02/20/2019) 1 kit 0    Elastic Bandages & Supports (COMFORT FIT MATERNITY SUPP LG) MISC 1 Units by Does not apply route daily. (Patient not taking: Reported on 02/13/2019) 1 each 0    tinidazole (TINDAMAX) 500 MG tablet tinidazole 500 mg tablet  Take 4 tablets every day by oral route for 2 days.        Review of Systems   All systems reviewed and negative except as stated in HPI  Blood pressure 116/75, pulse 94, temperature 98.6 F (37 C), temperature source Oral, resp. rate 18, last menstrual period 04/28/2018, SpO2 100 %, not currently breastfeeding. General  appearance: alert, cooperative, appears stated age and no distress Lungs: normal effort Heart: regular rate  Abdomen: soft, non-tender; bowel sounds normal Pelvic: gravid uterus GU: No vaginal lesions  Extremities: Homans sign is negative, no sign of DVT DTR's intact Presentation: cephalic by RN exam Fetal monitoringBaseline: 125 bpm, Variability: Good {> 6 bpm), Accelerations: Reactive and Decelerations: Absent Uterine activity: Date/time of onset: 02/24/19, reg q3-5 min Dilation: 3.5 Effacement (%): 60 Station: -1 Exam by:: Therisa Doyne, RN   Prenatal labs: ABO, Rh: A/Positive/-- (07/27 1453) Antibody: Negative (07/27 1453) Rubella: 2.14 (07/27 1453) RPR: Non Reactive (12/01 0953)  HBsAg: Negative (07/27 1453)  HIV: Non Reactive (12/01 0953)  GBS: Negative/-- (01/26 1057)  2 hr Glucola 21-19-41 Genetic screening  Declined Anatomy US WNL  Prenatal Transfer Tool  Maternal Diabetes: No Genetic Screening: Declined Maternal Ultrasounds/Referrals: Normal Fetal Ultrasounds or other Referrals:  None Maternal Substance Abuse:  No Significant Maternal Medications:  None Significant Maternal Lab Results: Group B Strep negative  No results found for this or any previous visit (from the past 24 hour(s)).  Patient Active Problem List   Diagnosis Date Noted   Chlamydia infection affecting pregnancy 08/14/2018   UTI (urinary tract infection) during pregnancy 08/14/2018   Short interval between pregnancies affecting pregnancy in second trimester, antepartum 08/07/2018   Supervision of other normal pregnancy, antepartum 07/24/2018   Pes planus 03/10/2012    Assessment/Plan:  BREIGH ANNETT is a 19 y.o. G2P1001 at 63w1dhere for SOL and NRFHT.  #Labor: Patient in latent labor with some cervical change since MAU visit this morning, but with NRFHT while on monitor, 3 decels noted at 1414, 1424, and 1512, all resolved with position changes and category I since then. While discussing  options of AROM vs pitocin, patient SROMed for clear. Expectant management for now. #Chlamydia: pending TOC with OB admit labs, treated as outpatient. #Pain: Per patient request, no epidural with G1 #FWB: Cat I-II, reassuring return to baseline, + accels, and moderate variability; EFW: 3200g #ID:  GBS neg #MOF: breast #MOC: PP IUD #Circ:  n/a  CGladys Damme MD CPorterResidency, PGY-1 02/25/2019, 3:25 PM    I confirm that I have verified the information documented in the resident's note and that I have also personally reperformed the history, physical exam and all medical decision making activities of this service and have verified that all service and findings are accurately documented in this student's note.   NWende Mott CNorth Dakota2/14/2021 7:56 PM

## 2019-02-25 NOTE — Plan of Care (Signed)

## 2019-02-25 NOTE — Progress Notes (Signed)
  Post-Placental IUD Insertion Procedure Note  Patient identified, informed consent signed prior to delivery, signed copy in chart, time out was performed.    Vaginal, labial and perineal areas thoroughly inspected for lacerations. 2nd degree laceration identified - not hemostatic,repaired prior to insertion of IUD. Liletta IUD grasped between sterile gloved fingers. Sterile lubrication applied to sterile gloved hand for ease of insertion. Fundus identified through abdominal wall using non-insertion hand. IUD inserted to fundus with bimanual technique. IUD carefully released at the fundus and insertion hand gently removed from vagina.    Strings trimmed to the level of the introitus. Patient tolerated procedure well.  Patient given post procedure instructions and IUD care card with expiration date.  Patient is asked to keep IUD strings tucked in her vagina until her postpartum follow up visit in 4-6 weeks. Patient advised to abstain from sexual intercourse and pulling on strings before her follow-up visit. Patient verbalized an understanding of the plan of care and agrees.   Rolm Bookbinder, CNM 02/25/19 7:53 PM

## 2019-02-25 NOTE — MAU Provider Note (Signed)
S: Ms. Patricia Koch is a 19 y.o. G2P1001 at [redacted]w[redacted]d  who presents to MAU today complaining contractions q 10-15 minutes since 0500. She denies vaginal bleeding. She denies LOF. She reports normal fetal movement.    O: BP 119/73 (BP Location: Right Arm)   Pulse 91   Temp 98.6 F (37 C) (Oral)   Resp 16   Ht 5\' 2"  (1.575 m)   Wt 76.8 kg   LMP 04/28/2018   SpO2 99%   BMI 30.98 kg/m  GENERAL: Well-developed, well-nourished female in no acute distress.  HEAD: Normocephalic, atraumatic.  CHEST: Normal effort of breathing, regular heart rate ABDOMEN: Soft, nontender, gravid  Cervical exam:  Dilation: 2 Effacement (%): 50 Cervical Position: Middle Station: -2 Presentation: Vertex Exam by:: [email or web address] RN    Fetal Monitoring: Baseline: 125 Variability: moderate Accelerations: 15x15 Decelerations: none Contractions: occasional uc's  Cervix unchanged after observation and patient sleeping  A: SIUP at [redacted]w[redacted]d  False labor  P: -Discharge home in stable condition -Labor precautions discussed -Patient advised to follow-up with [facility]  -Patient may return to MAU as needed or if her condition were to change or worsen  [redacted]w[redacted]d, Rolm Bookbinder 02/25/2019 8:38 AM

## 2019-02-25 NOTE — Discharge Summary (Signed)
Postpartum Discharge Summary      Patient Name: Patricia Koch DOB: [birth year] MRN: [id number]  Date of admission: 02/25/2019 Delivering Provider: Wende Mott   Date of discharge: 02/27/2019  Admitting diagnosis: Normal labor [O80, Z37.9] Intrauterine pregnancy: [redacted]w[redacted]d    Secondary diagnosis:  Principal Problem:   SVD (spontaneous vaginal delivery) Active Problems:   Second degree perineal laceration during delivery  Additional problems: None     Discharge diagnosis: Term Pregnancy Delivered                                                                                                Post partum procedures:None  Augmentation: none  Complications: None  Hospital course:  Onset of Labor With Vaginal Delivery     19y.o. yo [id number] admitted in Active Labor on 02/25/2019. Patient had an uncomplicated labor course as follows: Patient arrived at 5cm, SROM and rapidly delivered after.  Membrane Rupture Time/Date: 6:03 PM ,02/25/2019   Intrapartum Procedures: Episiotomy: None [1]                                         Lacerations:  2nd degree [3]  Patient had a delivery of a Viable infant. 02/25/2019  Information for the patient's newborn:  CoValen, Mascaro0[[id number]]Delivery Method: Vaginal, Spontaneous(Filed from Delivery Summary)     Pateint had an uncomplicated postpartum course.  She is ambulating, tolerating a regular diet, passing flatus, and urinating well. Patient is discharged home in stable condition on 02/27/19.  Delivery time: 6:21 PM    Magnesium Sulfate received: No BMZ received: No Rhophylac:No MMR:No Transfusion:No  Physical exam  Vitals:   02/26/19 0813 02/26/19 1338 02/26/19 2233 02/27/19 0528  BP: 107/69 117/78 103/75 105/71  Pulse: 82 90 90 66  Resp: _0 Temp: 98.3 F (36.8 C) 98.1 F (36.7 C) 99.4 F (37.4 C) 98 F (36.7 C)  TempSrc: Oral Oral Oral Oral  SpO2: 97% 97%  100%   General: alert, cooperative  and no distress  Chest: Lungs CTA, Heart RRR Abdomen: Soft, Appropriately Tender, BS x 4Q Lochia: appropriate Uterine Fundus: firm. U/-1 Incision: N/A DVT Evaluation: No significant calf/ankle edema. Labs: Lab Results  Component Value Date   WBC 13.5 (H) 02/25/2019   HGB 10.5 (L) 02/25/2019   HCT 34.0 (L) 02/25/2019   MCV 82.9 02/25/2019   PLT 190 02/25/2019   CMP Latest Ref Rng & Units 12/23/2018  Glucose 70 - 99 mg/dL 85  BUN 6 - 20 mg/dL 5(L)  Creatinine 0.44 - 1.00 mg/dL 0.75  Sodium 135 - 145 mmol/L 135  Potassium 3.5 - 5.1 mmol/L 3.7  Chloride 98 - 111 mmol/L 106  CO2 22 - 32 mmol/L 21(L)  Calcium 8.9 - 10.3 mg/dL 8.6(L)  Total Protein 6.5 - 8.1 g/dL 6.8  Total Bilirubin 0.3 - 1.2 mg/dL 0.4  Alkaline Phos 38 - 126 U/L 92  AST 15 - 41 U/L 25  ALT 0 - 44 U/L 10   Edinburgh Score: Edinburgh Postnatal Depression Scale Screening Tool 02/26/2019  I have been able to laugh and see the funny side of things. 0  I have looked forward with enjoyment to things. 0  I have blamed myself unnecessarily when things went wrong. 2  I have been anxious or worried for no good reason. 0  I have felt scared or panicky for no good reason. 0  Things have been getting on top of me. 1  I have been so unhappy that I have had difficulty sleeping. 0  I have felt sad or miserable. 0  I have been so unhappy that I have been crying. 0  The thought of harming myself has occurred to me. 0  Edinburgh Postnatal Depression Scale Total 3    Discharge instruction: per After Visit Summary and "Baby and Me Booklet". Pain Management, Peri-Care, Breastfeeding, Who and When to call for postpartum complications. Information Sheet(s) given Postpartum Depression, Iron Rich Diet   After visit meds:  Allergies as of 02/27/2019   No Known Allergies     Medication List    STOP taking these medications   Comfort Fit Maternity Supp Lg Misc   tinidazole 500 MG tablet Commonly known as: TINDAMAX     TAKE  these medications   Blood Pressure Monitoring Kit 1 kit by Does not apply route once a week.   Vitafol Gummies 3.33-0.333-34.8 MG Chew Chew 1 tablet by mouth daily.       Diet: routine diet  Activity: Advance as tolerated. Pelvic rest for 6 weeks.   Outpatient follow up:4 weeks Follow up Appt: Future Appointments  Date Time Provider [place]  03/28/2019  1:00 PM Nugent, Gerrie Nordmann, NP [facility] None   Follow up Visit: Follow-up [place] Follow up.   Specialty: Obstetrics and Gynecology Contact information: [address] [place] [phone]          Please schedule this patient for Postpartum visit in: 4 weeks with the following provider: Any provider In person For C/S patients schedule nurse incision check in weeks 2 weeks: no Low risk pregnancy complicated by: n/a Delivery mode:  SVD Anticipated Birth Control:  PP IUD Placed PP Procedures needed: n/a  Schedule Integrated [place] visit: no   Newborn Data: Live born female-Kalieyah Birth Weight: 7 lb 7.2 oz (3379 g) APGAR: 9, 9  Newborn Delivery   Birth date/time: 02/25/2019 18:21:00 Delivery type: Vaginal, Spontaneous      Baby Feeding: Breast Disposition:home with mother

## 2019-02-25 NOTE — MAU Note (Signed)
Patient presents to MAU c/o ctx q6-7 m for the past 2 hours.  +FM, denies vaginal bleeding or LOF.

## 2019-02-25 NOTE — Discharge Instructions (Signed)
Braxton Hicks Contractions °Contractions of the uterus can occur throughout pregnancy, but they are not always a sign that you are in labor. You may have practice contractions called Braxton Hicks contractions. These false labor contractions are sometimes confused with true labor. °What are Braxton Hicks contractions? °Braxton Hicks contractions are tightening movements that occur in the muscles of the uterus before labor. Unlike true labor contractions, these contractions do not result in opening (dilation) and thinning of the cervix. Toward the end of pregnancy (32-34 weeks), Braxton Hicks contractions can happen more often and may become stronger. These contractions are sometimes difficult to tell apart from true labor because they can be very uncomfortable. You should not feel embarrassed if you go to the hospital with false labor. °Sometimes, the only way to tell if you are in true labor is for your health care provider to look for changes in the cervix. The health care provider will do a physical exam and may monitor your contractions. If you are not in true labor, the exam should show that your cervix is not dilating and your water has not broken. °If there are no other health problems associated with your pregnancy, it is completely safe for you to be sent home with false labor. You may continue to have Braxton Hicks contractions until you go into true labor. °How to tell the difference between true labor and false labor °True labor °· Contractions last 30-70 seconds. °· Contractions become very regular. °· Discomfort is usually felt in the top of the uterus, and it spreads to the lower abdomen and low back. °· Contractions do not go away with walking. °· Contractions usually become more intense and increase in frequency. °· The cervix dilates and gets thinner. °False labor °· Contractions are usually shorter and not as strong as true labor contractions. °· Contractions are usually irregular. °· Contractions  are often felt in the front of the lower abdomen and in the groin. °· Contractions may go away when you walk around or change positions while lying down. °· Contractions get weaker and are shorter-lasting as time goes on. °· The cervix usually does not dilate or become thin. °Follow these instructions at home: ° °· Take over-the-counter and prescription medicines only as told by your health care provider. °· Keep up with your usual exercises and follow other instructions from your health care provider. °· Eat and drink lightly if you think you are going into labor. °· If Braxton Hicks contractions are making you uncomfortable: °? Change your position from lying down or resting to walking, or change from walking to resting. °? Sit and rest in a tub of warm water. °? Drink enough fluid to keep your urine pale yellow. Dehydration may cause these contractions. °? Do slow and deep breathing several times an hour. °· Keep all follow-up prenatal visits as told by your health care provider. This is important. °Contact a health care provider if: °· You have a fever. °· You have continuous pain in your abdomen. °Get help right away if: °· Your contractions become stronger, more regular, and closer together. °· You have fluid leaking or gushing from your vagina. °· You pass blood-tinged mucus (bloody show). °· You have bleeding from your vagina. °· You have low back pain that you never had before. °· You feel your baby’s head pushing down and causing pelvic pressure. °· Your baby is not moving inside you as much as it used to. °Summary °· Contractions that occur before labor are   called Braxton Hicks contractions, false labor, or practice contractions. °· Braxton Hicks contractions are usually shorter, weaker, farther apart, and less regular than true labor contractions. True labor contractions usually become progressively stronger and regular, and they become more frequent. °· Manage discomfort from Braxton Hicks contractions  by changing position, resting in a warm bath, drinking plenty of water, or practicing deep breathing. °This information is not intended to replace advice given to you by your health care provider. Make sure you discuss any questions you have with your health care provider. °Document Revised: 12/10/2016 Document Reviewed: 05/13/2016 °Elsevier Patient Education © 2020 Elsevier Inc. ° °

## 2019-02-25 NOTE — MAU Note (Signed)
QQIWLNL KAJSA BUTRUM is a 19 y.o. at [redacted]w[redacted]d here in MAU reporting: worsening contractions since she left MAU this morning. They are coming about every 3 minutes. Having some bleeding. No LOF. +FM  Onset of complaint: ongoing but getting worse  Pain score: 8/10  Vitals:   02/25/19 1358  BP: 116/75  Pulse: 94  Resp: 18  Temp: 98.6 F (37 C)  SpO2: 100%     FHT: +FM  Lab orders placed from triage: none

## 2019-02-26 LAB — RPR: RPR Ser Ql: NONREACTIVE

## 2019-02-26 LAB — TYPE AND SCREEN
ABO/RH(D): A POS
Antibody Screen: NEGATIVE

## 2019-02-26 NOTE — Lactation Note (Signed)
This note was copied from a baby's chart. Lactation Consultation Note  Patient Name: Girl Chenelle Benning JIRCV'E Date: 02/26/2019 Reason for consult: Initial assessment;Term P2, 9 hour term female infant. Per mom, infant has been latching well at breast, she breastfed for 20 minutes in L&D, 6 minutes, 30 and now 40 minutes she doesn't have any questions or concerns about breastfeeding at this time.  Per mom, she breastfed her 23 month old for 3 months. Mom is active on the [facility] program in [facility] she has medela hand pump and DEBP at home. LC entered room mom was breastfeeding infant on her left breast using the cradle hold positron mom was towards the end of the feeding but did not have any pillow support. LC place pillows behind mom and infant re-positing them both and mom was appreciative, infant breastfed for 40 minutes, good latch and swallows were observed. Mom knows to breastfeed according to hunger cues, on demand , 8 to 12 times within 24 hours and not to exceed 3 hours without breastfeeding infant. Mom know if she has questions, concerns or needs assistance with latching infant at breast to ask RN or LC. Mom made aware of O/P services, breastfeeding support groups, community resources, and our phone # for post-discharge questions.     Maternal Data Formula Feeding for Exclusion: No Has patient been taught Hand Expression?: Yes Does the patient have breastfeeding experience prior to this delivery?: Yes  Feeding Feeding Type: Breast Fed  LATCH Score Latch: Grasps breast easily, tongue down, lips flanged, rhythmical sucking.  Audible Swallowing: Spontaneous and intermittent  Type of Nipple: Everted at rest and after stimulation  Comfort (Breast/Nipple): Soft / non-tender  Hold (Positioning): Assistance needed to correctly position infant at breast and maintain latch.  LATCH Score: 9  Interventions Interventions: Breast feeding basics reviewed;Skin to skin;Breast  compression;Support pillows;Position options;Assisted with latch;Adjust position  Lactation Tools Discussed/Used WIC Program: Yes   Consult Status Consult Status: Follow-up Date: 02/27/19 Follow-up type: In-patient    Danelle Earthly 02/26/2019, 4:15 AM

## 2019-02-26 NOTE — Progress Notes (Signed)
Post Partum Day 1 Subjective: Patient reports feeling well. She is tolerating PO. Ambulating and urinating without difficulty. Lochia minimal. She would like to discharge tonight if baby can, otherwise she is fine with discharging tomorrow.  Objective: Blood pressure 111/71, pulse 79, temperature 98.1 F (36.7 C), temperature source Axillary, resp. rate 18, last menstrual period 04/28/2018, SpO2 99 %, unknown if currently breastfeeding.  Physical Exam:  General: alert, cooperative, appears stated age and no distress Lochia: appropriate Uterine Fundus: firm Incision: NA DVT Evaluation: No evidence of DVT seen on physical exam.  Recent Labs    02/25/19 1540  HGB 10.5*  HCT 34.0*    Assessment/Plan: Plan for discharge tomorrow; RN to page team for DC orders if baby can discharge today IUD placed post-placentally Vitals stable Breast and bottle feeding   LOS: 1 day   Joselyn Arrow 02/26/2019, 5:13 AM

## 2019-02-26 NOTE — Lactation Note (Signed)
This note was copied from a baby's chart. Lactation Consultation Note  Patient Name: Patricia Koch NGBMB'O Date: 02/26/2019 Reason for consult: Follow-up assessment;Term  P2 mother whose infant is now 38 hours old.  Mother breast fed her first child (now 78 months old) for 3 months.  Baby was asleep on father's chest when I arrived.  Mother had just finished breast feeding.  She feels like breast feeding is going well and she denies pain with latching.  Baby is content after feedings and mother feels uterine contractions during feedings.  Family was thinking about going home today, however, since it will be so late before they are allowed to leave their plan has changed.  Family willing to stay until tomorrow.  Mother will continue to feed on cue or at least 8-12 times/24 hours.  Encouraged her to call for lactation assistance as needed.  Mother has a DEBP and a manual pump for home use.  Birth certificate being completed now.   Maternal Data    Feeding Feeding Type: Breast Fed  LATCH Score                   Interventions    Lactation Tools Discussed/Used     Consult Status Consult Status: Follow-up Date: 02/27/19 Follow-up type: In-patient    Arvle Grabe R Mariangel Ringley 02/26/2019, 11:40 AM

## 2019-02-27 ENCOUNTER — Ambulatory Visit: Payer: Medicaid Other

## 2019-02-27 ENCOUNTER — Encounter: Payer: Medicaid Other | Admitting: Advanced Practice Midwife
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
## 2019-02-27 NOTE — Lactation Note (Signed)
This note was copied from a baby's chart. Lactation Consultation Note  Patient Name: Patricia Koch [id number] Date: 02/27/2019 Reason for consult: Follow-up assessment  LC and LC student, Legend Pecore, entered room for follow-up assessment. Mother of baby was present, sitting up in bed with baby skin to skin on her chest.   Mother of baby is 19 years old, G11P2002. Infant is currently at 40 hours of life. Mother of baby reports 3 months of breastfeeding experience with her first child.   Mother of baby reports that breastfeeding is going well. Mother of baby reports that she is not currently feeling any tenderness in her breast/nipple area. Mother of baby reports that infant seems to be getting enough and that infant has adequate output. [employer] student went over breast compressions with mother. Breast tissue is soft, with fullness apparent.   Mother of baby encouraged to continue with cue based feeding. Instructed to feed infant 8-12times/24hours. Encouraged mother of baby to call out to in-patient lactation when infant begins to show hunger cues, so that [facility] student can assist with a feed. Mother of baby encouraged to pump as needed. [facility] student provided hand pump. Outpatient lactation information given, as well as postpartum support group information.   Maternal Data Formula Feeding for Exclusion: No Has patient been taught Hand Expression?: Yes Does the patient have breastfeeding experience prior to this delivery?: Yes  Feeding Feeding Type: Breast Fed  LATCH Score                   Interventions Interventions: Breast feeding basics reviewed;Skin to skin;Breast compression  Lactation Tools Discussed/Used WIC Program: Yes   Consult Status Consult Status: Follow-up Date: 02/27/19 Follow-up type: In-patient    Jimmye Norman 02/27/2019, 10:34 AM
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
## 2019-02-27 NOTE — Lactation Note (Signed)
This note was copied from a baby's chart. Lactation Consultation Note LC acknowledges accuracy of assessment of LC student.  Patient Name: Patricia Koch [id number] Date: 02/27/2019 Reason for consult: Follow-up assessment   Maternal Data Formula Feeding for Exclusion: No Has patient been taught Hand Expression?: Yes Does the patient have breastfeeding experience prior to this delivery?: Yes  Feeding    LATCH Score                   Interventions Interventions: Breast feeding basics reviewed;Skin to skin;Breast compression  Lactation Tools Discussed/Used WIC Program: Yes   Consult Status Consult Status: Follow-up Date: 02/27/19 Follow-up type: In-patient    Stevan Born [facility] 02/27/2019, 10:45 AM

## 2019-02-27 NOTE — Discharge Instructions (Signed)
Iron-Rich Diet  Iron is a mineral that helps your body to produce hemoglobin. Hemoglobin is a protein in red blood cells that carries oxygen to your body's tissues. Eating too little iron may cause you to feel weak and tired, and it can increase your risk of infection. Iron is naturally found in many foods, and many foods have iron added to them (iron-fortified foods). You may need to follow an iron-rich diet if you do not have enough iron in your body due to certain medical conditions. The amount of iron that you need each day depends on your age, your sex, and any medical conditions you have. Follow instructions from your health care provider or a diet and nutrition specialist (dietitian) about how much iron you should eat each day. What are tips for following this plan? Reading food labels  Check food labels to see how many milligrams (mg) of iron are in each serving. Cooking  Cook foods in pots and pans that are made from iron.  Take these steps to make it easier for your body to absorb iron from certain foods: ? Soak beans overnight before cooking. ? Soak whole grains overnight and drain them before using. ? Ferment flours before baking, such as by using yeast in bread dough. Meal planning  When you eat foods that contain iron, you should eat them with foods that are high in vitamin C. These include oranges, peppers, tomatoes, potatoes, and mango. Vitamin C helps your body to absorb iron. General information  Take iron supplements only as told by your health care provider. An overdose of iron can be life-threatening. If you were prescribed iron supplements, take them with orange juice or a vitamin C supplement.  When you eat iron-fortified foods or take an iron supplement, you should also eat foods that naturally contain iron, such as meat, poultry, and fish. Eating naturally iron-rich foods helps your body to absorb the iron that is added to other foods or contained in a  supplement.  Certain foods and drinks prevent your body from absorbing iron properly. Avoid eating these foods in the same meal as iron-rich foods or with iron supplements. These foods include: ? Coffee, black tea, and red wine. ? Milk, dairy products, and foods that are high in calcium. ? Beans and soybeans. ? Whole grains. What foods should I eat? Fruits Prunes. Raisins. Eat fruits high in vitamin C, such as oranges, grapefruits, and strawberries, alongside iron-rich foods. Vegetables Spinach (cooked). Green peas. Broccoli. Fermented vegetables. Eat vegetables high in vitamin C, such as leafy greens, potatoes, bell peppers, and tomatoes, alongside iron-rich foods. Grains Iron-fortified breakfast cereal. Iron-fortified whole-wheat bread. Enriched rice. Sprouted grains. Meats and other proteins Beef liver. Oysters. Beef. Shrimp. Turkey. Chicken. Tuna. Sardines. Chickpeas. Nuts. Tofu. Pumpkin seeds. Beverages Tomato juice. Fresh orange juice. Prune juice. Hibiscus tea. Fortified instant breakfast shakes. Sweets and desserts Blackstrap molasses. Seasonings and condiments Tahini. Fermented soy sauce. Other foods Wheat germ. The items listed above may not be a complete list of recommended foods and beverages. Contact a dietitian for more information. What foods should I avoid? Grains Whole grains. Bran cereal. Bran flour. Oats. Meats and other proteins Soybeans. Products made from soy protein. Black beans. Lentils. Mung beans. Split peas. Dairy Milk. Cream. Cheese. Yogurt. Cottage cheese. Beverages Coffee. Black tea. Red wine. Sweets and desserts Cocoa. Chocolate. Ice cream. Other foods Basil. Oregano. Large amounts of parsley. The items listed above may not be a complete list of foods and beverages to avoid.   Contact a dietitian for more information. Summary  Iron is a mineral that helps your body to produce hemoglobin. Hemoglobin is a protein in red blood cells that carries  oxygen to your body's tissues.  Iron is naturally found in many foods, and many foods have iron added to them (iron-fortified foods).  When you eat foods that contain iron, you should eat them with foods that are high in vitamin C. Vitamin C helps your body to absorb iron.  Certain foods and drinks prevent your body from absorbing iron properly, such as whole grains and dairy products. You should avoid eating these foods in the same meal as iron-rich foods or with iron supplements. This information is not intended to replace advice given to you by your health care provider. Make sure you discuss any questions you have with your health care provider. Document Revised: 12/10/2016 Document Reviewed: 11/23/2016 Elsevier Patient Education  2020 [place] Depression When a woman feels excessive sadness, anger, or anxiety during pregnancy or during the first 12 months after she gives birth, she has a condition called perinatal depression. Depression can interfere with work, school, relationships, and other everyday activities. If it is not managed properly, it can also cause problems in the mother and her baby. Sometimes, perinatal depression is left untreated because symptoms are thought to be normal mood swings during and right after pregnancy. If you have symptoms of depression, it is important to talk with your health care provider. What are the causes? The exact cause of this condition is not known. Hormonal changes during and after pregnancy may play a role in causing perinatal depression. What increases the risk? You are more likely to develop this condition if:  You have a personal or family history of depression, anxiety, or mood disorders.  You experience a stressful life event during pregnancy, such as the death of a loved one.  You have a lot of regular life stress.  You do not have support from family members or loved ones, or you are in an abusive relationship. What are  the signs or symptoms? Symptoms of this condition include:  Feeling sad or hopeless.  Feelings of guilt.  Feeling irritable or overwhelmed.  Changes in your appetite.  Lack of energy or motivation.  Sleep problems.  Difficulty concentrating or completing tasks.  Loss of interest in hobbies or relationships.  Headaches or stomach problems that do not go away. How is this diagnosed? This condition is diagnosed based on a physical exam and mental evaluation. In some cases, your health care provider may use a depression screening tool. These tools include a list of questions that can help a health care provider diagnose depression. Your health care provider may refer you to a mental health expert who specializes in depression. How is this treated? This condition may be treated with:  Medicines. Your health care provider will only give you medicines that have been proven safe for pregnancy and breastfeeding.  Talk therapy with a mental health professional to help change your patterns of thinking (cognitive behavioral therapy).  Support groups.  Brain stimulation or light therapies.  Stress reduction therapies, such as mindfulness. Follow these instructions at home: Lifestyle  Do not use any products that contain nicotine or tobacco, such as cigarettes and e-cigarettes. If you need help quitting, ask your health care provider.  Do not use alcohol when you are pregnant. After your baby is born, limit alcohol intake to no more than 1 drink a day. One drink  equals 12 oz of beer, 5 oz of wine, or 1 oz of hard liquor.  Consider joining a support group for new mothers. Ask your health care provider for recommendations.  Take good care of yourself. Make sure you: ? Get plenty of sleep. If you are having trouble sleeping, talk with your health care provider. ? Eat a healthy diet. This includes plenty of fruits and vegetables, whole grains, and lean proteins. ? Exercise regularly, as  told by your health care provider. Ask your health care provider what exercises are safe for you. General instructions  Take over-the-counter and prescription medicines only as told by your health care provider.  Talk with your partner or family members about your feelings during pregnancy. Share any concerns or anxieties that you may have.  Ask for help with tasks or chores when you need it. Ask friends and family members to provide meals, watch your children, or help with cleaning.  Keep all follow-up visits as told by your health care provider. This is important. Contact a health care provider if:  You (or people close to you) notice that you have any symptoms of depression.  You have depression and your symptoms get worse.  You experience side effects from medicines, such as nausea or sleep problems. Get help right away if:  You feel like hurting yourself, your baby, or someone else. If you ever feel like you may hurt yourself or others, or have thoughts about taking your own life, get help right away. You can go to your nearest emergency department or call:  Your local emergency services (911 in the [place]  A suicide crisis helpline, such as the National Suicide Prevention Lifeline at 9061111683. This is open 24 hours a day. Summary  Perinatal depression is when a woman feels excessive sadness, anger, or anxiety during pregnancy or during the first 12 months after she gives birth.  If perinatal depression is not treated, it can lead to health problems for the mother and her baby.  This condition is treated with medicines, talk therapy, stress reduction therapies, or a combination of two or more treatments.  Talk with your partner or family members about your feelings. Do not be afraid to ask for help. This information is not intended to replace advice given to you by your health care provider. Make sure you discuss any questions you have with your health care  provider. Document Revised: 06/14/2018 Document Reviewed: 02/25/2016 Elsevier Patient Education  2020 [employer].

## 2019-03-28 ENCOUNTER — Other Ambulatory Visit: Payer: Self-pay

## 2019-03-28 ENCOUNTER — Ambulatory Visit (INDEPENDENT_AMBULATORY_CARE_PROVIDER_SITE_OTHER): Payer: Medicaid Other | Admitting: Women's Health

## 2019-03-28 ENCOUNTER — Encounter: Payer: Self-pay | Admitting: Women's Health

## 2019-03-28 DIAGNOSIS — T8332XA Displacement of intrauterine contraceptive device, initial encounter: Secondary | ICD-10-CM

## 2019-03-28 NOTE — Patient Instructions (Addendum)
Breastfeeding and Human Lactation (4th ed., pp. 262-299). [place], [place]: [employer].">   Breastfeeding and Mastitis    Mastitis is inflammation of the breast tissue. It can occur in women who are breastfeeding. This can make breastfeeding painful. Mastitis will sometimes go away on its own, especially if it is not caused by an infection (non-infectious mastitis). Your health care provider will help determine if medical treatment is needed. Treatment may be needed if the condition is caused by a bacterial infection (infectious mastitis).  What are the causes?  This condition is often associated with a blocked milkduct, which can happen when too much milk builds up in the breast. Causes of excess milk in the breast can include:  Poor latch-on. If your baby is not latched onto the breast properly, he or she may not empty your breast completely while breastfeeding.  Allowing too much time to pass between feedings.  Wearing a bra or other clothing that is too tight. This puts extra pressure on the milk ducts so milk does not flow through them as it should.  Milk remaining in the breast because it is overfilled (engorged).  Stress and fatigue.  Mastitis can also be caused by a bacterial infection. Bacteria may enter the breast tissue through cuts, cracks, or openings in the skin near the nipple area. Cracks in the skin are often caused when your baby does not latch on properly to the breast.  What are the signs or symptoms?  Symptoms of this condition include:  Swelling, redness, tenderness, and pain in an area of the breast. This usually affects the upper part of the breast, toward the armpit region. In most cases, it affects only one breast. In some cases, it may occur on both breasts at the same time and affect a larger portion of breast tissue.  Swelling of the glands under the arm on the same side.  Fatigue, headache, and flu-like muscle aches.  Fever.  Rapid pulse.  Symptoms usually last 2 to 5  days. Breast pain and redness are at their worst on day 2 and day 3, and they usually go away by day 5. If an infection is left to progress, a collection of pus (abscess) may develop.  How is this diagnosed?  This condition can be diagnosed based on your symptoms and a physical exam. You may also have tests, such as:  Blood tests to determine if your body is fighting a bacterial infection.  Mammogram or ultrasound tests to rule out other problems or diseases.  Fluid tests. If an abscess has developed, the fluid in the abscess may be removed with a needle. The fluid may be analyzed to determine if bacteria are present.  Breast milk may be cultured and tested for bacteria.  How is this treated?  This condition will sometimes go away on its own. Your health care provider may choose to wait 24 hours after first seeing you to decide whether treatment is needed. If treatment is needed, it may include:  Strategies to manage breastfeeding. This includes continuing to breastfeed or pump in order to allow adequate milk flow, using breast massage, and applying heat or cold to the affected area.  Self-care such as rest and increased fluid intake.  Medicine for pain.  Antibiotic medicine to treat a bacterial infection. This is usually taken by mouth.  If an abscess has developed, it may be treated by removing fluid with a needle.  Follow these instructions at home:  Medicines  Take   were prescribed an antibiotic medicine, take it as told by your health care provider. Do not stop taking the antibiotic even if you start to feel better. General instructions  Do not wear a tight or underwire bra. Wear a soft, supportive bra.  Increase your fluid intake, especially if you have a fever.  Get plenty of rest. For breastfeeding:  Continue to empty your breasts as often as possible, either by  breastfeeding or using an electric breast pump. This will lower the pressure and the pain that comes with it. Ask your health care provider if changes need to be made to your breastfeeding or pumping routine.  Keep your nipples clean and dry.  During breastfeeding, empty the first breast completely before going to the other breast. If your baby is not emptying your breasts completely, use a breast pump to empty your breasts.  Use breast massage during feeding or pumping sessions.  If directed, apply moist heat to the affected area of your breast right before breastfeeding or pumping. Use the heat source that your health care provider recommends.  If directed, put ice on the affected area of your breast right after breastfeeding or pumping: ? Put ice in a plastic bag. ? Place a towel between your skin and the bag. ? Leave the ice on for 20 minutes.  If you go back to work, pump your breasts while at work to stay in time with your nursing schedule.  Do not allow your breasts to become engorged. Contact a health care provider if:  You have pus-like discharge from the breast.  You have a fever.  Your symptoms do not improve within 2 days of starting treatment.  Your symptoms return after you have recovered from a breast infection. Get help right away if:  Your pain and swelling are getting worse.  You have pain that is not controlled with medicine.  You have a red line extending from the breast toward your armpit. Summary  Mastitis is inflammation of the breast tissue. It is often caused by a blocked milk duct or bacteria.  This condition may be treated with hot and cold compresses, medicines, self-care, and certain breastfeeding strategies.  If you were prescribed an antibiotic medicine, take it as told by your health care provider. Do not stop taking the antibiotic even if you start to feel better.  Continue to empty your breasts as often as possible either by breastfeeding or  using an electric breast pump. This information is not intended to replace advice given to you by your health care provider. Make sure you discuss any questions you have with your health care provider. Document Revised: 09/16/2017 Document Reviewed: 12/30/2015 Elsevier Patient Education  2020 [employer]. Constipation, Adult Constipation is when a person has fewer bowel movements in a week than normal, has difficulty having a bowel movement, or has stools that are dry, hard, or larger than normal. Constipation may be caused by an underlying condition. It may become worse with age if a person takes certain medicines and does not take in enough fluids. Follow these instructions at home: Eating and drinking   Eat foods that have a lot of fiber, such as fresh fruits and vegetables, whole grains, and beans.  Limit foods that are high in fat, low in fiber, or overly processed, such as french fries, hamburgers, cookies, candies, and soda.  Drink enough fluid to keep your urine clear or pale yellow. General instructions  Exercise regularly or as told by your health care  provider.  Go to the restroom when you have the urge to go. Do not hold it in.  Take over-the-counter and prescription medicines only as told by your health care provider. These include any fiber supplements.  Practice pelvic floor retraining exercises, such as deep breathing while relaxing the lower abdomen and pelvic floor relaxation during bowel movements.  Watch your condition for any changes.  Keep all follow-up visits as told by your health care provider. This is important. Contact a health care provider if:  You have pain that gets worse.  You have a fever.  You do not have a bowel movement after 4 days.  You vomit.  You are not hungry.  You lose weight.  You are bleeding from the anus.  You have thin, pencil-like stools. Get help right away if:  You have a fever and your symptoms suddenly get  worse.  You leak stool or have blood in your stool.  Your abdomen is bloated.  You have severe pain in your abdomen.  You feel dizzy or you faint. This information is not intended to replace advice given to you by your health care provider. Make sure you discuss any questions you have with your health care provider. Document Revised: 12/10/2016 Document Reviewed: 06/18/2015 Elsevier Patient Education  2020 [employer].

## 2019-03-28 NOTE — Progress Notes (Signed)
Post Partum Exam  Patricia Koch is a 19 y.o. G42P2002 female who presents for a postpartum visit. She is one month postpartum following a vaginal delivery:I have fully reviewed the prenatal and intrapartum course. The delivery was at 39 gestational weeks.  Anesthesia: none. Postpartum course has been uncomplicated. Baby's course has been uncomplicated. Baby is feeding by breast. Bleeding small amount. Bowel function is normal. Bladder function is normal. Patient is not sexually active. Contraception method IUD. Postpartum depression screening: neg.  The following portions of the patient's history were reviewed and updated as appropriate: allergies, current medications, past family history, past medical history, past social history, past surgical history and problem list. Pap smear n/a due to age.  Review of Systems Pertinent items noted in HPI and remainder of comprehensive ROS otherwise negative.    Objective:  Blood pressure 101/69, pulse 72, weight 156 lb 9.6 oz (71 kg), unknown if currently breastfeeding.  General:  alert, cooperative and no distress   Breasts:  pt declines exam.  Lungs: clear to auscultation bilaterally  Heart:  regular rate and rhythm, S1, S2 normal, no murmur, click, rub or gallop  Abdomen: soft, non-tender; bowel sounds normal; no masses,  no organomegaly   Vulva:  normal  Vagina: normal vagina and perineal lacteration appears well-healed.  Cervix:  no lesions and parous appearance, IUD strings not visualized  Corpus: normal size, contour, position, consistency, mobility, non-tender  Adnexa:  normal adnexa and no mass, fullness, tenderness  Rectal Exam: Not performed. and stool palpated in rectum through vaginal wall on imanual exam.        Assessment:    Normal postpartum exam with constipation noted and IUD strings not present at cervical os. Pap smear not done at today's visit, patient age 6.  Plan:   1. Contraception: IUD, strings not visible, pelvic US  ordered, patient advised not to have intercourse until IUD is located or reinserted. 2. Stool present in rectum on exam, patient denies issues with constipation, information given on constipation relief measures. 3. Follow up in: 1 year for WWE and Pap or as needed.

## 2019-03-30 DIAGNOSIS — H5213 Myopia, bilateral: Secondary | ICD-10-CM | POA: Diagnosis not present

## 2019-04-02 DIAGNOSIS — H5213 Myopia, bilateral: Secondary | ICD-10-CM | POA: Diagnosis not present

## 2019-04-06 ENCOUNTER — Ambulatory Visit (HOSPITAL_COMMUNITY): Admission: RE | Admit: 2019-04-06 | Payer: Medicaid Other | Source: Ambulatory Visit

## 2019-04-17 ENCOUNTER — Other Ambulatory Visit (HOSPITAL_COMMUNITY)
Admission: RE | Admit: 2019-04-17 | Discharge: 2019-04-17 | Disposition: A | Discharge status: Home / Self Care | Payer: Medicaid Other | Source: Ambulatory Visit | Attending: Obstetrics & Gynecology | Admitting: Obstetrics & Gynecology

## 2019-04-17 ENCOUNTER — Ambulatory Visit (HOSPITAL_COMMUNITY)
Admission: RE | Admit: 2019-04-17 | Discharge: 2019-04-17 | Disposition: A | Discharge status: Home / Self Care | Payer: Medicaid Other | Source: Ambulatory Visit | Attending: Women's Health | Admitting: Women's Health

## 2019-04-17 ENCOUNTER — Other Ambulatory Visit: Payer: Self-pay

## 2019-04-17 ENCOUNTER — Ambulatory Visit (INDEPENDENT_AMBULATORY_CARE_PROVIDER_SITE_OTHER): Payer: Medicaid Other | Admitting: Obstetrics & Gynecology

## 2019-04-17 ENCOUNTER — Encounter: Payer: Self-pay | Admitting: Obstetrics & Gynecology

## 2019-04-17 VITALS — BP 104/70 | HR 56 | Wt 157.0 lb

## 2019-04-17 DIAGNOSIS — T8332XA Displacement of intrauterine contraceptive device, initial encounter: Secondary | ICD-10-CM | POA: Diagnosis not present

## 2019-04-17 DIAGNOSIS — Z113 Encounter for screening for infections with a predominantly sexual mode of transmission: Secondary | ICD-10-CM | POA: Diagnosis not present

## 2019-04-17 NOTE — Progress Notes (Signed)
GYNECOLOGY VIRTUAL VISIT ENCOUNTER NOTE  Provider location: [facility] [place]   I connected with [name] on 04/17/19 at  2:30 PM EDT by MyChart Video Encounter at home and verified that I am speaking with the correct person using two identifiers.   I discussed the limitations, risks, security and privacy concerns of performing an evaluation and management service virtually and the availability of in person appointments. I also discussed with the patient that there may be a patient responsible charge related to this service. The patient expressed understanding and agreed to proceed.   History:  Patricia Koch is a 19 y.o. G30P2002 female being evaluated today for retained IUD and recent unprotected intercourse. . She denies any abnormal vaginal discharge, bleeding, pelvic pain or other concerns.       Past Medical History:  Diagnosis Date  . Anemia   . Headache   . Pes planus    Past Surgical History:  Procedure Laterality Date  . NOSE SURGERY    . WISDOM TOOTH EXTRACTION     The following portions of the patient's history were reviewed and updated as appropriate: allergies, current medications, past family history, past medical history, past social history, past surgical history and problem list.      Review of Systems:  Pertinent items noted in HPI and remainder of comprehensive ROS otherwise negative.  Physical Exam:   General:  Alert, oriented and cooperative. Patient appears to be in no acute distress.  Mental Status: Normal mood and affect. Normal behavior. Normal judgment and thought content.   Respiratory: Normal respiratory effort, no problems with respiration noted  Rest of physical exam deferred due to type of encounter  Labs and Imaging No results found for this or any previous visit (from the past 336 hour(s)). US Pelvis Complete  Result Date: 04/17/2019 CLINICAL DATA:  Post partum IUD insertion. Strings missing. Initial  encounter. EXAM: TRANSABDOMINAL ULTRASOUND OF PELVIS TECHNIQUE: Transabdominal ultrasound examination of the pelvis was performed including evaluation of the uterus, ovaries, adnexal regions, and pelvic cul-de-sac. COMPARISON:  None. FINDINGS: Uterus Measurements: 7.2 x 3.8 x 6.2 cm = volume: 89 mL. No fibroids or other mass visualized. Endometrium Thickness: Not well delineated given IUD in place, however appear subcentimeter. Linear intrauterine device is present in the endometrium, however the arms appear in the lower uterine segment, with the stem extending towards the uterine fundus. Right ovary Measurements: 2.1 x 1.9 x 1.8 cm = volume: 3.9 mL. Normal appearance/no adnexal mass. Left ovary Measurements: 1.5 x 1.4 x 1.7 cm = volume: 1.9 mL. Normal appearance/no adnexal mass. Other findings:  No abnormal free fluid. IMPRESSION: 1. Intrauterine device in the endometrium, however appears malposition and flipped with the arms in the lower uterine segment and stem directed towards the fundus. 2. Normal sonographic appearance of the ovaries. Electronically Signed   By: Keith Rake M.D.   On: 04/17/2019 14:05       Assessment and Plan:     1. Screening examination for STD (sexually transmitted disease) STD screen done - Cervicovaginal ancillary only  2. Retained IUD- strings not noted on SSE. U/S noted IUD in uterus, but inverted. Discussed that if pain/bleeding noted consistently, many need re imaging.  Plan for operative hysteroscopy when removal is desired.       I discussed the assessment and treatment plan with the patient. The patient was provided an opportunity to ask questions and all were answered. The patient agreed with the  plan and demonstrated an understanding of the instructions.   The patient was advised to call back or seek an in-person evaluation/go to the ED if the symptoms worsen or if the condition fails to improve as anticipated.  I provided 15 minutes of face-to-face time  during this encounter.   Malachy Chamber, MD [facility] [employer], [facility]

## 2019-04-18 LAB — CERVICOVAGINAL ANCILLARY ONLY
Bacterial Vaginitis (gardnerella): POSITIVE — AB
Candida Glabrata: NEGATIVE
Candida Vaginitis: NEGATIVE
Chlamydia: NEGATIVE
Comment: NEGATIVE
Comment: NEGATIVE
Comment: NEGATIVE
Comment: NEGATIVE
Comment: NEGATIVE
Comment: NORMAL
Neisseria Gonorrhea: NEGATIVE
Trichomonas: NEGATIVE

## 2019-04-23 ENCOUNTER — Ambulatory Visit: Payer: Medicaid Other | Admitting: Obstetrics and Gynecology

## 2019-04-27 ENCOUNTER — Other Ambulatory Visit: Payer: Self-pay | Admitting: Obstetrics & Gynecology

## 2019-05-07 DIAGNOSIS — H5213 Myopia, bilateral: Secondary | ICD-10-CM | POA: Diagnosis not present

## 2019-06-07 MED ORDER — METRONIDAZOLE 500 MG PO TABS: 500.0000 mg | ORAL_TABLET | Freq: Two times a day (BID) | ORAL | 0 refills | Status: AC

## 2019-06-07 NOTE — Addendum Note (Signed)
Addended by: Raynelle Dick on: 06/07/2019 03:59 PM   Modules accepted: Orders

## 2019-06-15 IMAGING — US US MFM OB LIMITED
1 series · 15 of 27 positions shown · non-contrast
Comparison: none

[Series 1: us mfm ob limited · 27 acquisitions, 15 frames shown]
[im 1/27]
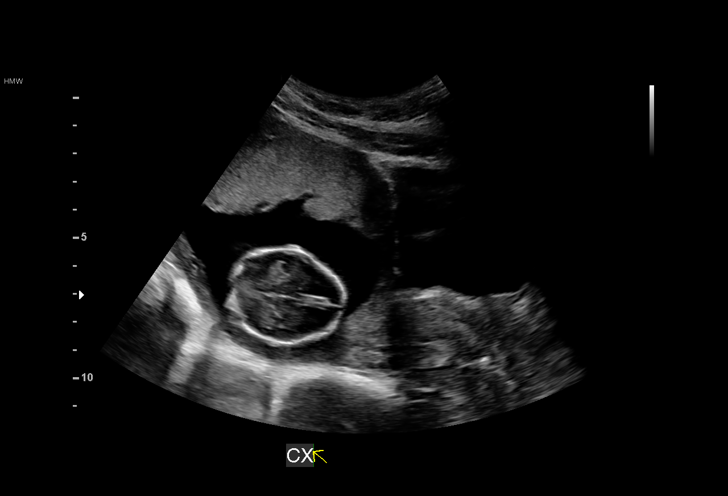
[im 3/27]
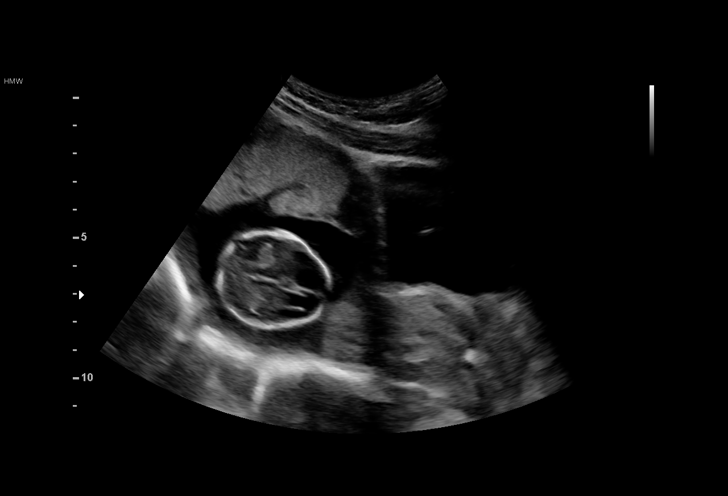
[im 5/27]
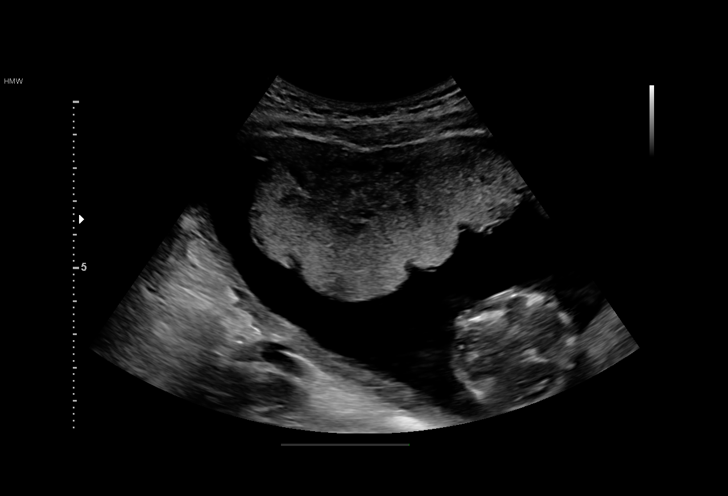
[im 7/27]
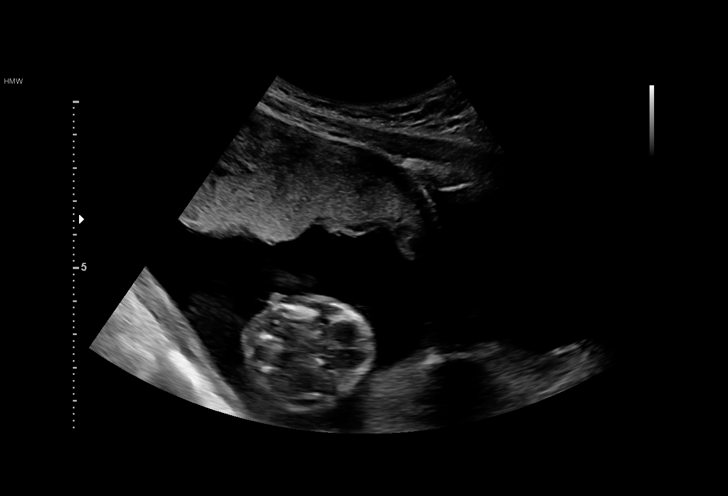
[im 9/27]
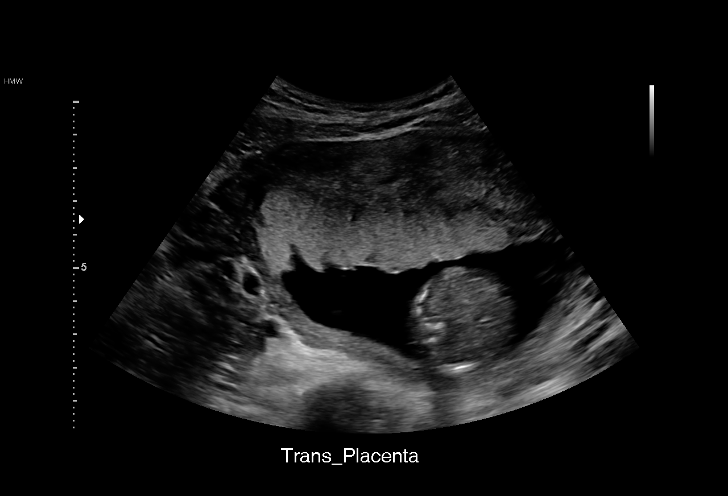
[im 10/27]
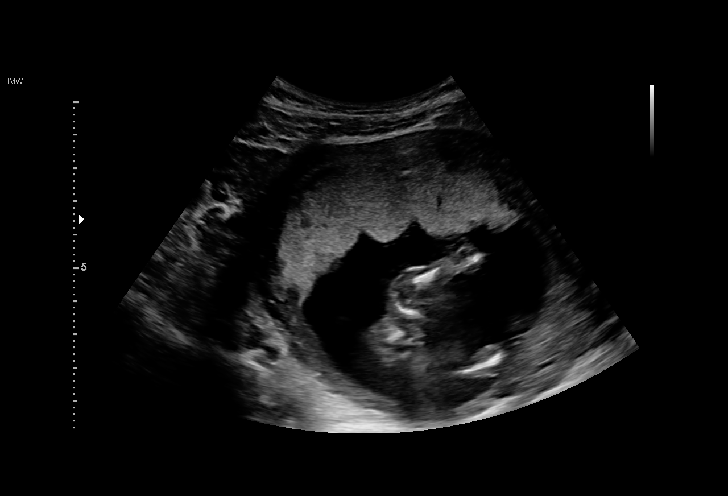
[im 12/27]
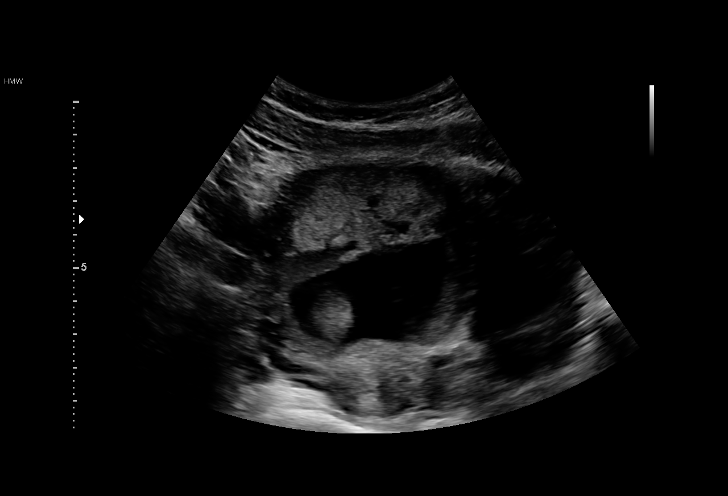
[im 14/27]
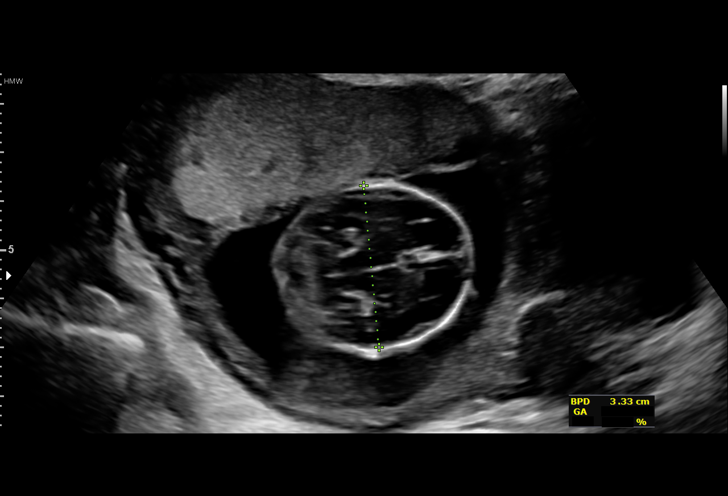
[im 16/27]
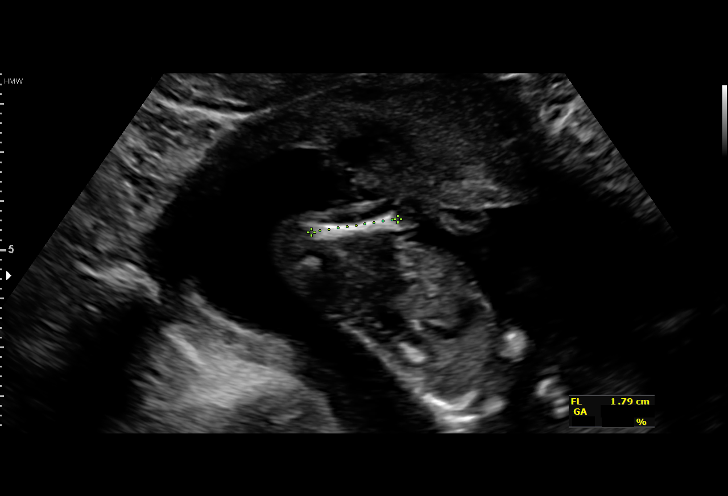
[im 18/27]
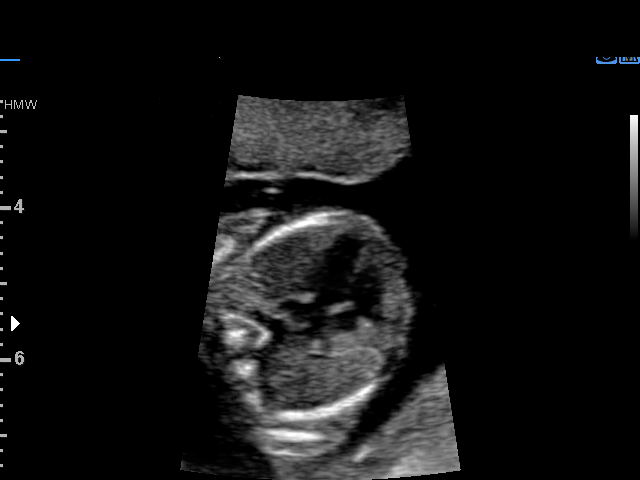
[im 19/27]
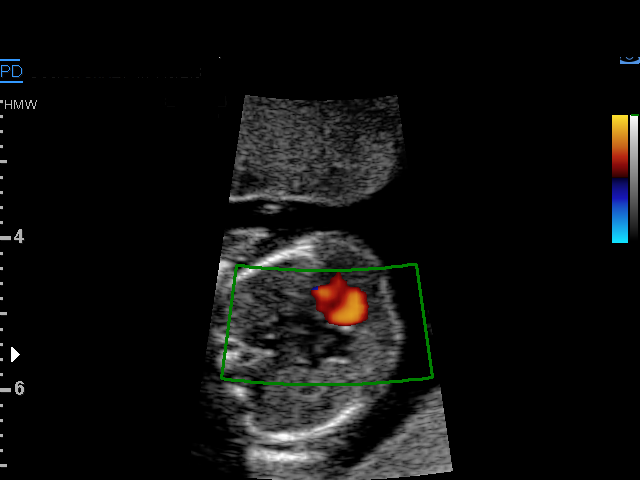
[im 21/27]
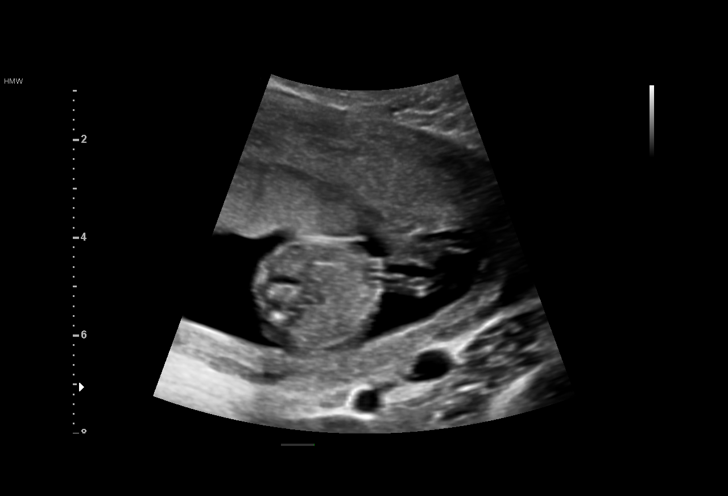
[im 23/27]
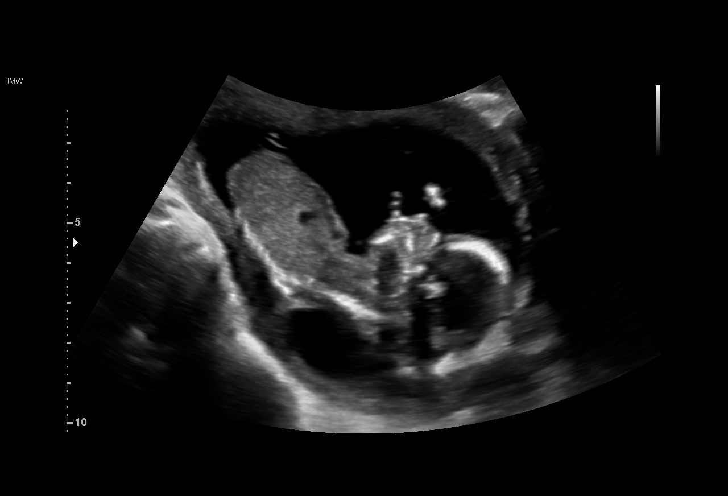
[im 25/27]
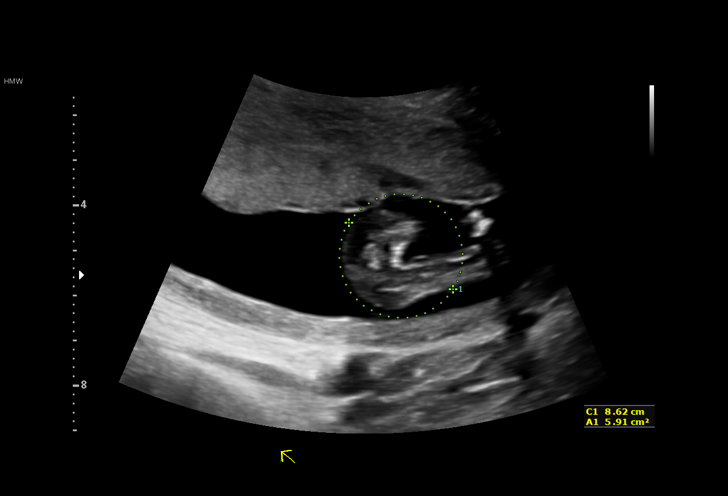
[im 27/27]
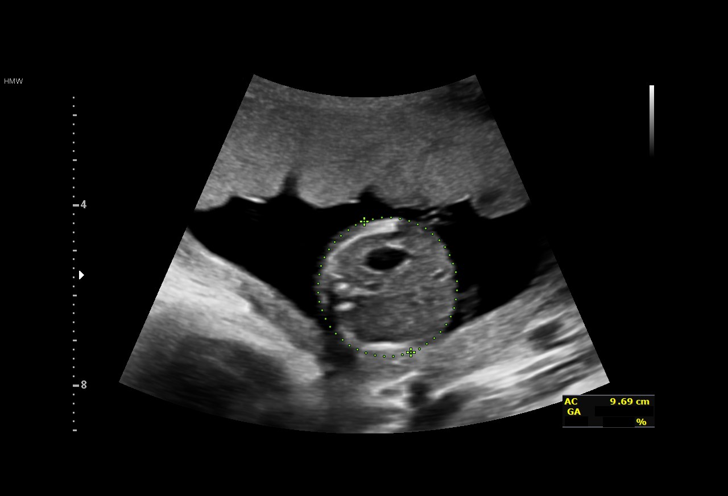

[15 of 27 positions shown; findings below may reference images not displayed]

Indications

15 weeks gestation of pregnancy
Encounter for uncertain dates
OB History

Blood Type:            Height:  5'2"   Weight (lb):  149       BMI:
Gravidity:    1         Term:   0        Prem:   0        SAB:   0
TOP:          0       Ectopic:  0        Living: 0
Fetal Evaluation

Num Of Fetuses:     1
Fetal Heart         135
Rate(bpm):
Cardiac Activity:   Observed
Presentation:       Cephalic
Placenta:           Anterior, above cervical os
P. Cord Insertion:  Not well visualized

Amniotic Fluid
AFI FV:      Subjectively within normal limits

Largest Pocket(cm)
2.74
Biometry

BPD:      33.3  mm     G. Age:  16w 3d         92  %
FL/BPD:     53.8   %
FL:       17.9  mm     G. Age:  15w 2d         52  %
Gestational Age
LMP:           15w 1d        Date:  11/11/16                 EDD:   08/18/17
U/S Today:     15w 6d                                        EDD:   08/13/17
Best:          15w 1d     Det. By:  LMP  (11/11/16)          EDD:   08/18/17
Anatomy

Thoracic:              Appears normal         Cord Vessels:           Appears normal (3
vessel cord)
Stomach:               Appears normal, left   Bladder:                Appears normal
sided
Cervix Uterus Adnexa

Cervix
Length:           3.82  cm.
Normal appearance by transabdominal scan.

Uterus
No abnormality visualized.

Left Ovary
No adnexal mass visualized.

Right Ovary
No adnexal mass visualized.

Cul De Sac:   No free fluid seen.

Adnexa:       No abnormality visualized.
Impression

Singleton intrauterine pregnancy with uncertain dates here for
dating scan
Review of the anatomy shows no sonographic markers for
aneuploidy or structural anomalies
However, views should be considered suboptimal secondary
to the early gestational age
Amniotic fluid volume is normal
Biometry suggests an EGA of 15+6 weeks. This agrees with
her menstrual dating
Recommendations

Recommend using EDC of 08/18/2017
Recommend anatomic survey in 4 weeks

## 2019-07-24 DIAGNOSIS — M25512 Pain in left shoulder: Secondary | ICD-10-CM | POA: Diagnosis not present

## 2019-10-02 DIAGNOSIS — Z20822 Contact with and (suspected) exposure to covid-19: Secondary | ICD-10-CM | POA: Diagnosis not present

## 2019-10-10 ENCOUNTER — Encounter: Payer: Self-pay | Admitting: Nurse Practitioner

## 2019-10-10 DIAGNOSIS — Z975 Presence of (intrauterine) contraceptive device: Secondary | ICD-10-CM | POA: Insufficient documentation

## 2019-10-11 ENCOUNTER — Encounter: Payer: Self-pay | Admitting: Nurse Practitioner

## 2019-10-11 ENCOUNTER — Other Ambulatory Visit: Payer: Self-pay

## 2019-10-11 ENCOUNTER — Other Ambulatory Visit (HOSPITAL_COMMUNITY)
Admission: RE | Admit: 2019-10-11 | Discharge: 2019-10-11 | Disposition: A | Discharge status: Home / Self Care | Payer: Medicaid Other | Source: Ambulatory Visit | Attending: Nurse Practitioner | Admitting: Nurse Practitioner

## 2019-10-11 ENCOUNTER — Ambulatory Visit (INDEPENDENT_AMBULATORY_CARE_PROVIDER_SITE_OTHER): Payer: Medicaid Other | Admitting: Nurse Practitioner

## 2019-10-11 VITALS — BP 122/90 | HR 77 | Wt 172.0 lb

## 2019-10-11 DIAGNOSIS — Z01419 Encounter for gynecological examination (general) (routine) without abnormal findings: Secondary | ICD-10-CM | POA: Diagnosis not present

## 2019-10-11 LAB — POCT URINE PREGNANCY: Preg Test, Ur: NEGATIVE

## 2019-10-11 NOTE — Progress Notes (Signed)
GYNECOLOGY ANNUAL PREVENTATIVE CARE ENCOUNTER NOTE  Subjective:   Patricia Koch is a 19 y.o. G56P2002 female here for a routine annual gynecologic exam.  Current complaints: wants a pregnancy test as IUD is "flipped" in uterus and having some symptoms that makes her think she is pregnant - feels the same as she did in early pregnancy.   Denies abnormal vaginal bleeding, discharge, pelvic pain, problems with intercourse or other gynecologic concerns.    Gynecologic History No LMP recorded. Contraception: IUD - no strings as IUD is upside down in the uterus - placed postplacentally Last Pap: never had before - just turned 19  Obstetric History OB History  Gravida Para Term Preterm AB Living  2 2 2     2   SAB TAB Ectopic Multiple Live Births        0 2    # Outcome Date GA Lbr Len/2nd Weight Sex Delivery Anes PTL Lv  2 Term 02/25/19 [redacted]w[redacted]d 10:50 / 00:01 7 lb 7.2 oz (3.379 kg) F Vag-Spont None  LIV  1 Term 08/22/17 [redacted]w[redacted]d 07:35 5 lb 13 oz (2.635 kg) F Vag-Spont None  LIV    Past Medical History:  Diagnosis Date  . Anemia   . Headache   . Pes planus     Past Surgical History:  Procedure Laterality Date  . NOSE SURGERY    . WISDOM TOOTH EXTRACTION      Current Outpatient Medications on File Prior to Visit  Medication Sig Dispense Refill  . BEPREVE 1.5 % SOLN 1 drop 2 (two) times daily.    . cyclobenzaprine (FLEXERIL) 5 MG tablet Take 5 mg by mouth 3 (three) times daily.    . naproxen (NAPROSYN) 500 MG tablet Take 500 mg by mouth 2 (two) times daily.     No current facility-administered medications on file prior to visit.    No Known Allergies  Social History   Socioeconomic History  . Marital status: Single    Spouse name: Not on file  . Number of children: Not on file  . Years of education: Not on file  . Highest education level: Not on file  Occupational History    Employer: [employer]  Tobacco Use  . Smoking status: Never Smoker  . Smokeless tobacco:  Never Used  Vaping Use  . Vaping Use: Never used  Substance and Sexual Activity  . Alcohol use: Not Currently  . Drug use: Never  . Sexual activity: Yes  Other Topics Concern  . Not on file  Social History Narrative  . Not on file   Social Determinants of Health   Financial Resource Strain:   . Difficulty of Paying Living Expenses: Not on file  Food Insecurity:   . Worried About [redacted]w[redacted]d in the Last Year: Not on file  . Ran Out of Food in the Last Year: Not on file  Transportation Needs:   . Lack of Transportation (Medical): Not on file  . Lack of Transportation (Non-Medical): Not on file  Physical Activity:   . Days of Exercise per Week: Not on file  . Minutes of Exercise per Session: Not on file  Stress:   . Feeling of Stress : Not on file  Social Connections:   . Frequency of Communication with Friends and Family: Not on file  . Frequency of Social Gatherings with Friends and Family: Not on file  . Attends Religious Services: Not on file  . Active Member of Clubs or  Organizations: Not on file  . Attends Banker Meetings: Not on file  . Marital Status: Not on file  Intimate Partner Violence:   . Fear of Current or Ex-Partner: Not on file  . Emotionally Abused: Not on file  . Physically Abused: Not on file  . Sexually Abused: Not on file    Family History  Problem Relation Age of Onset  . Cancer Paternal Grandmother   . Miscarriages / [place] Mother   . Cancer Maternal Grandmother   . Diabetes Maternal Grandmother   . Hypertension Maternal Grandmother     The following portions of the patient's history were reviewed and updated as appropriate: allergies, current medications, past family history, past medical history, past social history, past surgical history and problem list.  Review of Systems Pertinent items noted in HPI and remainder of comprehensive ROS otherwise negative.   Objective:  BP 122/90   Pulse 77   Wt 172 lb (78  kg)   BMI 31.46 kg/m  CONSTITUTIONAL: Well-developed, well-nourished female in no acute distress.  HENT:  Normocephalic, atraumatic, External right and left ear normal.  EYES: Conjunctivae and EOM are normal. Pupils are equal, round.  No scleral icterus.  NECK: Normal range of motion, supple, no masses.  Normal thyroid.  SKIN: Skin is warm and dry. No rash noted. Not diaphoretic. No erythema. No pallor. NEUROLOGIC: Alert and oriented to person, place, and time. Normal reflexes, muscle tone coordination. No cranial nerve deficit noted. PSYCHIATRIC: Normal mood and affect. Normal behavior. Normal judgment and thought content. CARDIOVASCULAR: Normal heart rate noted, regular rhythm RESPIRATORY: Clear to auscultation bilaterally. Effort and breath sounds normal, no problems with respiration noted. BREASTS: Symmetric in size. No masses, no tenderness, skin changes, nipple drainage, or lymphadenopathy. ABDOMEN: Soft, no distention noted.  No tenderness, rebound or guarding.  PELVIC: Normal appearing external genitalia; normal appearing vaginal mucosa and cervix.  No abnormal discharge noted.  Pap smear obtained.  Normal uterine size, no other palpable masses, no uterine or adnexal tenderness. MUSCULOSKELETAL: Normal range of motion. No tenderness.  No cyanosis, clubbing, or edema.    Assessment and Plan:  1. Encounter for annual routine gynecological examination Will continue IUD Pregnancy test negative today  - Cervicovaginal ancillary only( [place]) - Cytology - PAP( [place]) - RPR - Hepatitis C Antibody - HIV antibody (with reflex)  Will follow up results of pap smear and manage accordingly. Routine preventative health maintenance measures emphasized. Please refer to After Visit Summary for other counseling recommendations.    Nolene Bernheim, RN, MSN, NP-BC Nurse Practitioner, [facility]

## 2019-10-11 NOTE — Patient Instructions (Signed)
Check out Covid vaccine info here:   ttps://covid19.ncdhhs.gov/vaccines  7 Things You Should Know About the COVID-19 Vaccines  Here are seven facts you should know before taking your shot:  1.  No serious side effects were reported in clinical trials. Temporary reactions after receiving the vaccine may include a sore arm, headache, feeling tired and achy for a day or two or, in some cases, fever. In most cases, these reactions are good signs that your body is building protection.   2.  Scientists had a head start. They are built on decades of research on vaccines for similar viruses. A big investment of resources and focus made sure they were created without skipping any steps in development, testing, or clinical trials.  3. You cannot get COVID-19 from the vaccine. The vaccine gives your body instructions to make a protein that safely teaches you to make germ-fighting antibodies to fight the real COVID-19.  4.The vaccine protects against the Delta variant. The Delta variant, which is now predominant in [place], is much more contagious than the original virus. Vaccines continue to be remarkably effective in reducing risk of severe disease, hospitalization, and death, even against the Delta variant.  5. A hundred million people in the [place] have already received their COVID-19 vaccine.  6. It works. And once you're fully vaccinated you're protected. The vaccines are proven to help prevent COVID-19 and are effective in preventing hospitalization and death.   7. The vaccine does not affect fertility. Vaccination for those who are pregnant or wanting to become pregnant is recommended by the American College of Obstetricians and Gynecologists (ACOG), the Society for Maternal-Fetal Medicine (SMFM), the American Society for Reproductive Medicine (ASRM), and the Society for Female Reproduction and Urology.  

## 2019-10-12 LAB — CERVICOVAGINAL ANCILLARY ONLY
Bacterial Vaginitis (gardnerella): NEGATIVE
Candida Glabrata: NEGATIVE
Candida Vaginitis: NEGATIVE
Chlamydia: POSITIVE — AB
Comment: NEGATIVE
Comment: NEGATIVE
Comment: NEGATIVE
Comment: NEGATIVE
Comment: NEGATIVE
Comment: NORMAL
Neisseria Gonorrhea: NEGATIVE
Trichomonas: NEGATIVE

## 2019-10-12 LAB — HIV ANTIBODY (ROUTINE TESTING W REFLEX): HIV Screen 4th Generation wRfx: NONREACTIVE

## 2019-10-12 LAB — HEPATITIS C ANTIBODY: Hep C Virus Ab: 0.1 s/co ratio (ref 0.0–0.9)

## 2019-10-12 LAB — RPR: RPR Ser Ql: NONREACTIVE

## 2019-10-12 LAB — HEPATITIS B SURFACE ANTIGEN: Hepatitis B Surface Ag: NEGATIVE

## 2019-10-13 LAB — CYTOLOGY - PAP

## 2019-10-15 ENCOUNTER — Other Ambulatory Visit: Payer: Self-pay

## 2019-10-15 ENCOUNTER — Encounter: Payer: Self-pay | Admitting: Nurse Practitioner

## 2019-10-15 DIAGNOSIS — R87612 Low grade squamous intraepithelial lesion on cytologic smear of cervix (LGSIL): Secondary | ICD-10-CM | POA: Insufficient documentation

## 2019-10-15 MED ORDER — DOXYCYCLINE HYCLATE 100 MG PO CAPS: 100.0000 mg | ORAL_CAPSULE | Freq: Two times a day (BID) | ORAL | 0 refills | Status: DC

## 2019-10-15 NOTE — Telephone Encounter (Signed)
Patient tested positive for chlamydia and will need to be treated per protocol. She has been informed to make her partner aware of test results and to return to the office in 4 weeks for test of cure.

## 2020-02-29 DIAGNOSIS — Z20822 Contact with and (suspected) exposure to covid-19: Secondary | ICD-10-CM | POA: Diagnosis not present

## 2020-10-23 ENCOUNTER — Other Ambulatory Visit (HOSPITAL_COMMUNITY)
Admission: RE | Admit: 2020-10-23 | Discharge: 2020-10-23 | Disposition: A | Discharge status: Home / Self Care | Payer: Medicaid Other | Source: Ambulatory Visit

## 2020-10-23 ENCOUNTER — Other Ambulatory Visit: Payer: Self-pay

## 2020-10-23 ENCOUNTER — Ambulatory Visit (INDEPENDENT_AMBULATORY_CARE_PROVIDER_SITE_OTHER): Payer: Medicaid Other

## 2020-10-23 VITALS — BP 130/88 | HR 74 | Wt 178.4 lb

## 2020-10-23 DIAGNOSIS — Z01419 Encounter for gynecological examination (general) (routine) without abnormal findings: Secondary | ICD-10-CM | POA: Insufficient documentation

## 2020-10-23 DIAGNOSIS — B3731 Acute candidiasis of vulva and vagina: Secondary | ICD-10-CM | POA: Diagnosis not present

## 2020-10-23 DIAGNOSIS — B9689 Other specified bacterial agents as the cause of diseases classified elsewhere: Secondary | ICD-10-CM | POA: Diagnosis not present

## 2020-10-23 DIAGNOSIS — Z113 Encounter for screening for infections with a predominantly sexual mode of transmission: Secondary | ICD-10-CM | POA: Insufficient documentation

## 2020-10-23 DIAGNOSIS — T8332XA Displacement of intrauterine contraceptive device, initial encounter: Secondary | ICD-10-CM | POA: Insufficient documentation

## 2020-10-23 DIAGNOSIS — Z8742 Personal history of other diseases of the female genital tract: Secondary | ICD-10-CM | POA: Diagnosis not present

## 2020-10-23 DIAGNOSIS — T8332XS Displacement of intrauterine contraceptive device, sequela: Secondary | ICD-10-CM

## 2020-10-23 DIAGNOSIS — N760 Acute vaginitis: Secondary | ICD-10-CM

## 2020-10-23 NOTE — Progress Notes (Signed)
GYNECOLOGY OFFICE VISIT NOTE-WELL WOMAN EXAM  History:   Patricia Koch [id number] here today for "annual exam and check up."  She states she would like STD testing and has concern for BV.   She denies any abnormal vaginal discharge, but reports an odor that she describes as "not normal." No bleeding, pelvic pain or dyspareunia.   Birth Control:  IUD  Reproductive Concerns Sexually Active: Yes Partners Type: Female, No Condom Usage Number of partners in last year: One STD Testing: Desires Full  Vaginal/GU Concerns:She denies pain or discomfort with urination or constipation or diarrhea.  Breast Concerns/Exams: No concerns.  Patient reports her maternal great grandmother had breast cancer, unsure if death was due to this. Patient denies other known family history of uterine, cervical, or ovarian cancer  Medical and Nutrition PCP: [place]; Appt scheduled for November Significant PMx: None Exercise: "Kinda"  Tobacco/Drugs/Alcohol: Occasional Alcohol Nutrition: Desires "metabolism booster" states nutrition can be better. Reports decrease in sweets.   Social Safety at home: Endorses DV/A: Denies Social Support: Endorses Employment: [employer]  Past Medical History:  Diagnosis Date  . Anemia   . Headache   . Pes planus     Past Surgical History:  Procedure Laterality Date  . NOSE SURGERY    . WISDOM TOOTH EXTRACTION      The following portions of the patient's history were reviewed and updated as appropriate: allergies, current medications, past family history, past medical history, past social history, past surgical history and problem list.   Health Maintenance:  LSIL pap and negative HRHPV on Sept 2021.  No mammogram on d/t age .   Review of Systems:  Pertinent items noted in HPI and remainder of comprehensive ROS otherwise negative.    Objective:    Physical Exam BP 130/88   Pulse 74   Wt 178 lb 6.4 oz (80.9 kg)   LMP  (LMP Unknown)   BMI 32.63  kg/m  Physical Exam Exam conducted with a chaperone present.  Constitutional:      Appearance: Normal appearance.  HENT:     Head: Atraumatic.  Eyes:     Conjunctiva/sclera: Conjunctivae normal.  Neck:     Thyroid: No thyroid mass or thyroid tenderness.  Cardiovascular:     Rate and Rhythm: Normal rate and regular rhythm.     Heart sounds: Normal heart sounds.  Pulmonary:     Effort: Pulmonary effort is normal. No respiratory distress.  Abdominal:     General: Bowel sounds are normal.     Palpations: Abdomen is soft.  Genitourinary:    Labia:        Right: No tenderness or lesion.        Left: No tenderness or lesion.      Vagina: Vaginal discharge (Scant amt thin white) present. No tenderness.     Cervix: No cervical motion tenderness or friability.     Comments: Pap collected with brush and spatula No IUD strings appreciated Cervix appears open, No bleeding or discharge. CV collected from posterior fornix.  BME without tenderness or apparent uterine enlargement.  Adnexa not appreciated Musculoskeletal:        General: No swelling. Normal range of motion.     Cervical back: Normal range of motion.     Right lower leg: No edema.     Left lower leg: No edema.  Neurological:     Mental Status: She is alert and oriented to person, place, and time.  Psychiatric:  Mood and Affect: Mood normal.        Behavior: Behavior normal.        Thought Content: Thought content normal.     Labs and Imaging No results found for this or any previous visit (from the past 168 hour(s)). No results found.   Assessment & Plan:  20 year old Annual Exam H/O Abnormal Pap-LSIL Desires STD Testing IUD-Malposition Known  1. Encounter for annual routine gynecological examination -Exam performed and findings discussed. -Educated on AHA exercise recommendations of 30 minutes of moderate to vigorous activity at least 5x/week. -Educated and encouraged to initiate monthly SBE with  increased breast awareness including examination of breast for skin changes, moles, tenderness, etc.  -Discussed initiation of clinical breast exam around age 60 or earlier as warranted.    2. History of abnormal cervical Pap smear -Reviewed previous results and recommendation for repeat -Educated on ASCCP guidelines regarding pap smear evaluation and frequency. -Informed of turnover time and provider/clinic policy on releasing results. -Pap collected without issues.   3. Screening examination for STD (sexually transmitted disease) -STD testing ordered.   4. Intrauterine contraceptive device threads lost, sequela -April 2021 US shows IUD in arms in LUS. -Precautions given for when to contact office for repeat US.    Routine preventative health maintenance measures emphasized. Please refer to After Visit Summary for other counseling recommendations.   No follow-ups on file.      Cherre Robins, CNM 10/23/2020

## 2020-10-24 LAB — HEPB+HEPC+HIV PANEL
HIV Screen 4th Generation wRfx: NONREACTIVE
Hep B C IgM: NEGATIVE
Hep B Core Total Ab: NEGATIVE
Hep B E Ab: NEGATIVE
Hep B E Ag: NEGATIVE
Hep B Surface Ab, Qual: NONREACTIVE
Hep C Virus Ab: <0.1 s/co ratio (ref 0.0–0.9)
Hepatitis B Surface Ag: NEGATIVE

## 2020-10-24 LAB — CERVICOVAGINAL ANCILLARY ONLY
Bacterial Vaginitis (gardnerella): POSITIVE — AB
Candida Glabrata: NEGATIVE
Candida Vaginitis: POSITIVE — AB
Chlamydia: NEGATIVE
Comment: NEGATIVE
Comment: NEGATIVE
Comment: NEGATIVE
Comment: NEGATIVE
Comment: NEGATIVE
Comment: NORMAL
Neisseria Gonorrhea: NEGATIVE
Trichomonas: NEGATIVE

## 2020-10-24 LAB — RPR: RPR Ser Ql: NONREACTIVE

## 2020-10-25 MED ORDER — METRONIDAZOLE 0.75 % VA GEL: 1.0000 | Freq: Two times a day (BID) | VAGINAL | 0 refills | Status: DC

## 2020-10-25 MED ORDER — FLUCONAZOLE 150 MG PO TABS: 150.0000 mg | ORAL_TABLET | Freq: Every day | ORAL | 1 refills | Status: DC

## 2020-10-25 NOTE — Addendum Note (Signed)
Addended by: Gerrit Heck L on: 10/25/2020 05:14 AM   Modules accepted: Orders

## 2020-10-28 LAB — CYTOLOGY - PAP
Comment: NEGATIVE
Diagnosis: UNDETERMINED — AB
High risk HPV: NEGATIVE

## 2020-11-03 ENCOUNTER — Telehealth: Payer: Self-pay | Admitting: *Deleted

## 2020-11-03 NOTE — Telephone Encounter (Signed)
Pt called to office with questions about recent labs.  Discussed pap and swab results and that Rx were sent.

## 2021-02-03 IMAGING — US US MFM OB FOLLOW-UP
1 series · 14 of 28 positions shown · non-contrast
Comparison: none

[Series 1: us mfm ob follow-up · 14 of 90 slices shown]
[im 4/90]
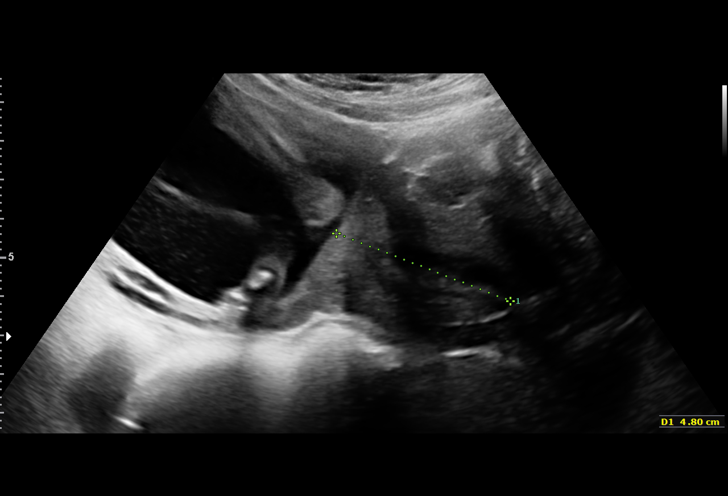
[im 10/90]
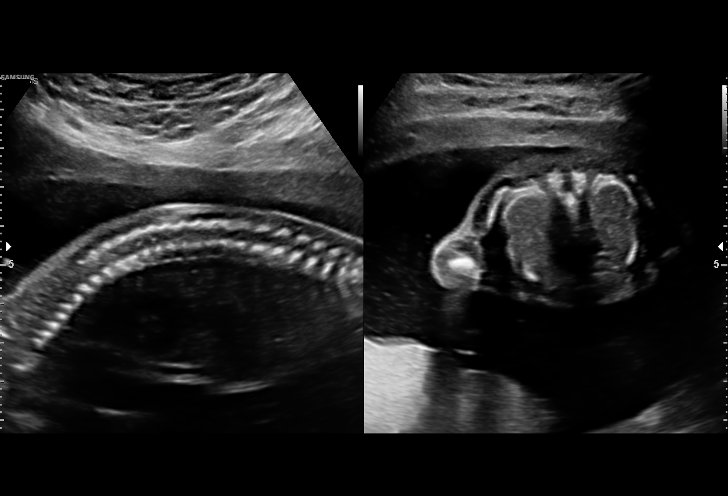
[im 17/90]
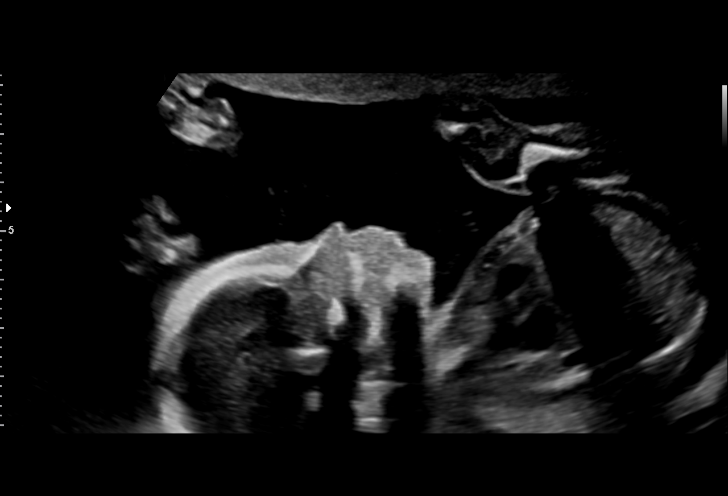
[im 24/90]
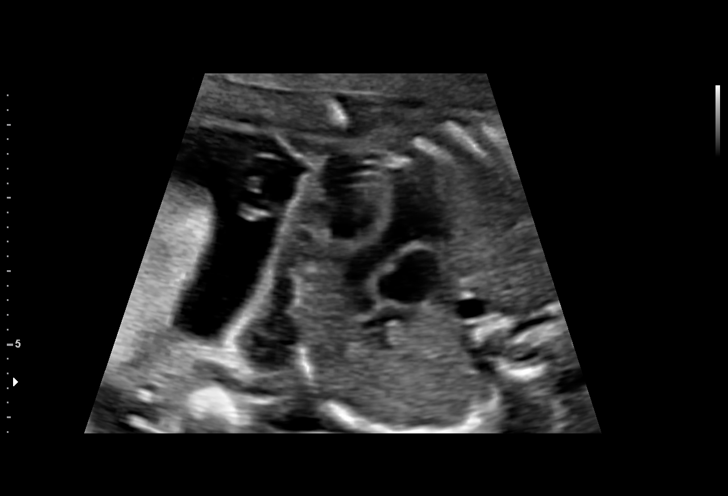
[im 30/90]
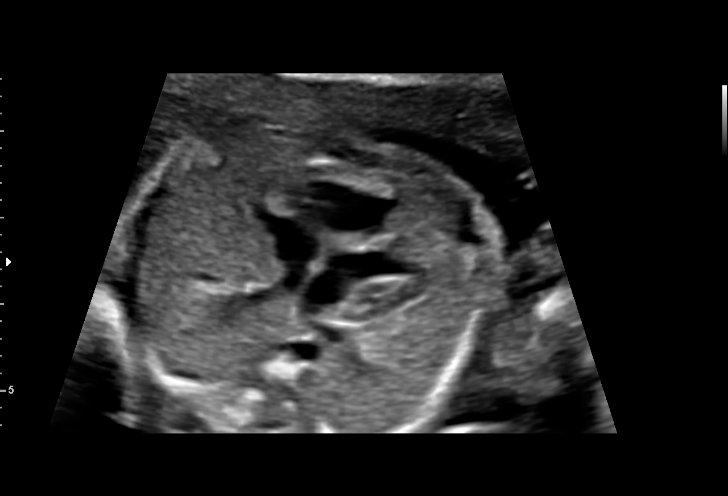
[im 37/90]
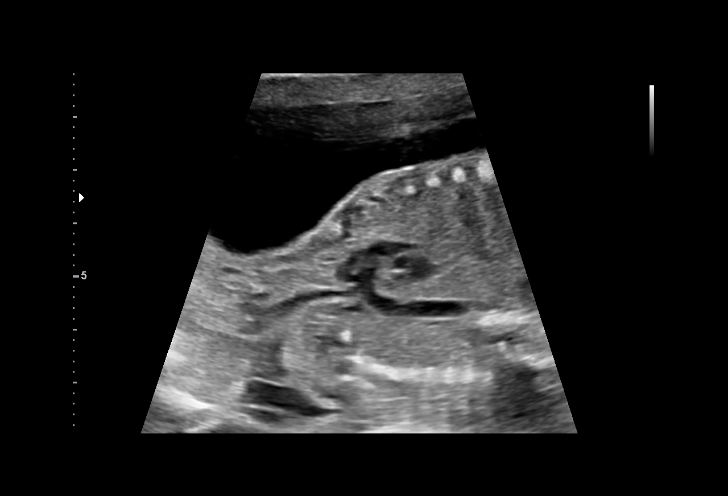
[im 43/90]
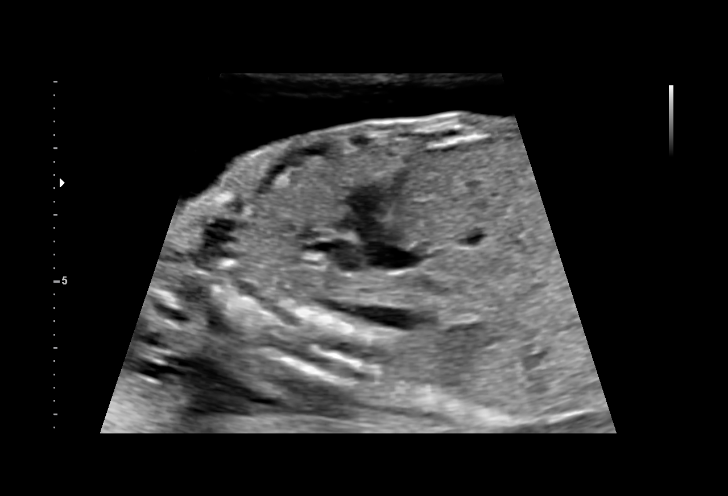
[im 50/90]
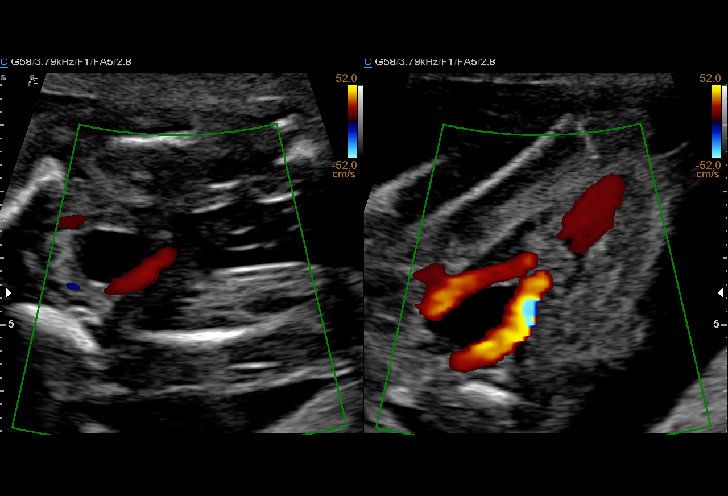
[im 57/90]
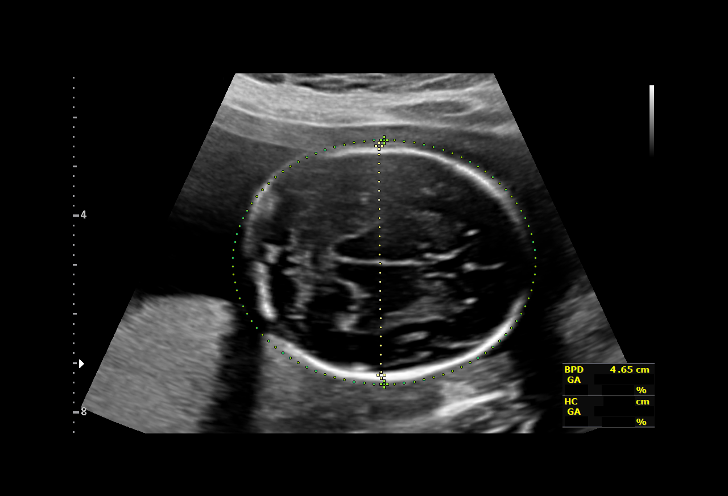
[im 63/90]
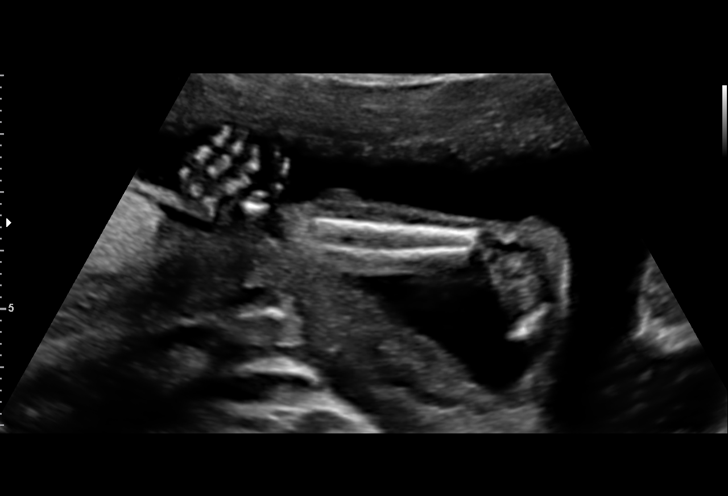
[im 70/90]
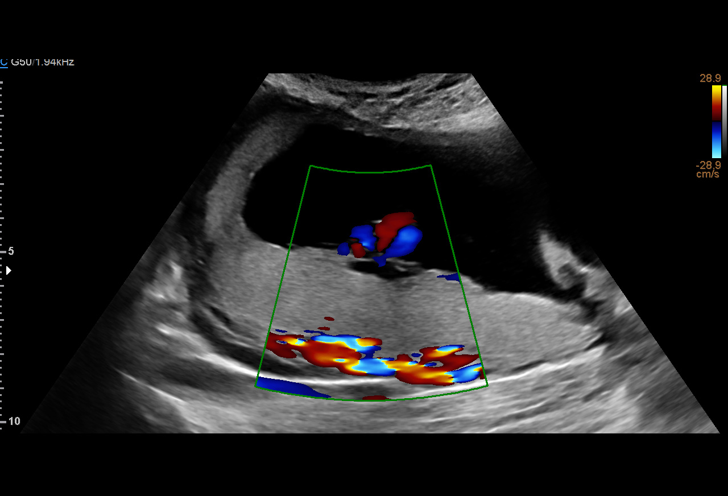
[im 76/90]
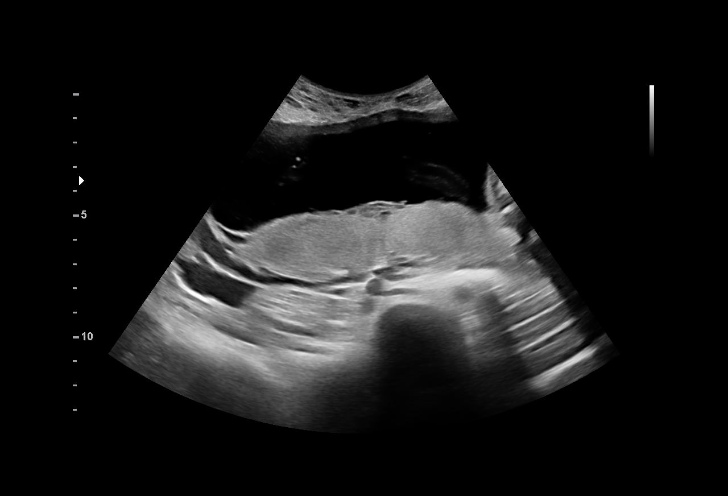
[im 83/90]
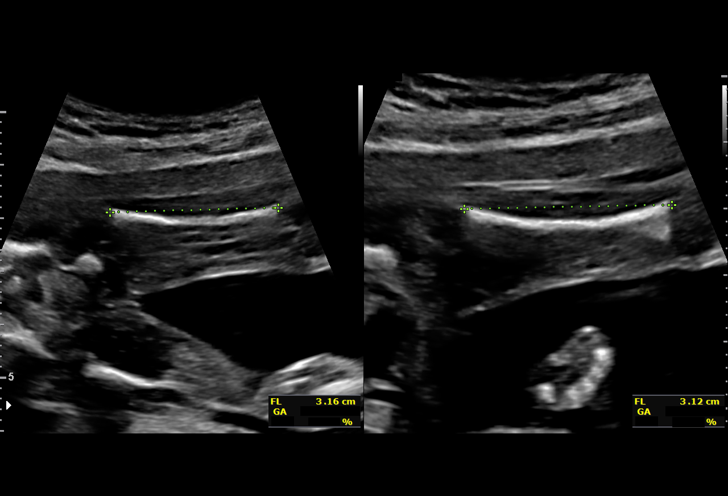
[im 90/90]
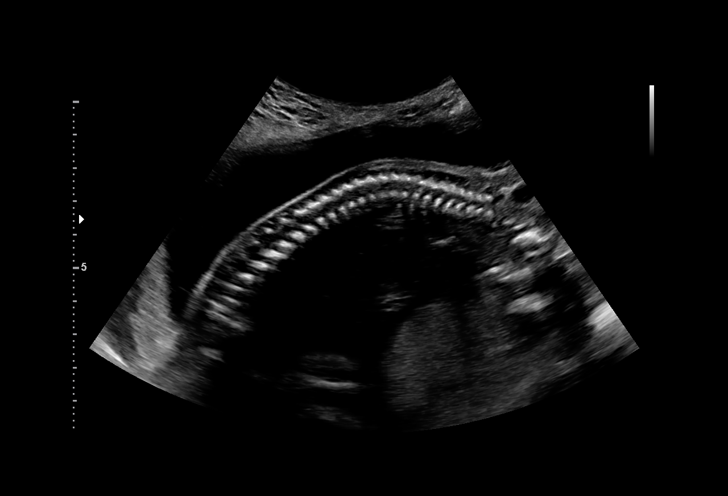

[14 of 28 positions shown; findings below may reference images not displayed]

[place]
 ----------------------------------------------------------------------

 ----------------------------------------------------------------------
Indications

  Encounter for uncertain dates
  Antenatal follow-up for nonvisualized fetal
  anatomy
  Short interval between pregancies, 2nd
  trimester
  Teen pregnancy
  20 weeks gestation of pregnancy

 ----------------------------------------------------------------------
Fetal Evaluation

 Num Of Fetuses:         1
 Fetal Heart Rate(bpm):  161
 Cardiac Activity:       Observed
 Presentation:           Breech, footling
 Placenta:               Posterior
 P. Cord Insertion:      Visualized, central

 Amniotic Fluid
 AFI FV:      Within normal limits

                             Largest Pocket(cm)

Biometry

 BPD:      47.3  mm     G. Age:  20w 2d         44  %    CI:         73.5   %    70 - 86
                                                         FL/HC:      17.9   %    16.8 -
 HC:      175.3  mm     G. Age:  20w 0d         25  %    HC/AC:      1.12        1.09 -
 AC:      156.2  mm     G. Age:  20w 5d         56  %    FL/BPD:     66.4   %
 FL:       31.4  mm     G. Age:  19w 5d         20  %    FL/AC:      20.1   %    20 - 24
 Est. FW:     344  gm    0 lb 12 oz      37  %
OB History

 Gravidity:    2         Term:   1        Prem:   0        SAB:   0
 TOP:          0       Ectopic:  0        Living: 1
Gestational Age

 U/S Today:     20w 1d                                        EDD:   03/05/19
 Best:          20w 3d     Det. By:  U/S  (09/12/18)          EDD:   03/03/19
Anatomy

 Cranium:               Appears normal         LVOT:                   Appears normal
 Cavum:                 Appears normal         Aortic Arch:            Appears normal
 Ventricles:            Appears normal         Ductal Arch:            Appears normal
 Choroid Plexus:        Appears normal         Diaphragm:              Appears normal
 Cerebellum:            Appears normal         Stomach:                Appears normal, left
                                                                       sided
 Posterior Fossa:       Appears normal         Abdomen:                Appears normal
 Nuchal Fold:           Previously seen        Abdominal Wall:         Appears nml (cord
                                                                       insert, abd wall)
 Face:                  Orbits and profile     Cord Vessels:           Appears normal (3
                        previously seen                                vessel cord)
 Lips:                  Previously seen        Kidneys:                Appear normal
 Palate:                Appears normal         Bladder:                Appears normal
 Thoracic:              Appears normal         Spine:                  Appears normal
 Heart:                 Appears normal         Upper Extremities:      Previously seen
                        (4CH, axis, and
                        situs)
 RVOT:                  Appears normal         Lower Extremities:      Appears normal

 Other:  Fetus appears to be female. Heels visualized. Nasal bone visualized.
Cervix Uterus Adnexa

 Cervix
 Length:            4.8  cm.
 Normal appearance by transabdominal scan.

 Uterus
 No abnormality visualized.
Impression

 Normal interval growth.  No ultrasonic evidence of structural
 fetal anomalies.
 Anatomy complete
Recommendations

 Follow up as clincially indicated.

## 2021-03-20 DIAGNOSIS — Z113 Encounter for screening for infections with a predominantly sexual mode of transmission: Secondary | ICD-10-CM | POA: Diagnosis not present

## 2021-03-20 DIAGNOSIS — N898 Other specified noninflammatory disorders of vagina: Secondary | ICD-10-CM | POA: Diagnosis not present

## 2021-03-20 DIAGNOSIS — E6609 Other obesity due to excess calories: Secondary | ICD-10-CM | POA: Diagnosis not present

## 2021-03-20 DIAGNOSIS — K130 Diseases of lips: Secondary | ICD-10-CM | POA: Diagnosis not present

## 2021-03-20 DIAGNOSIS — Z6834 Body mass index (BMI) 34.0-34.9, adult: Secondary | ICD-10-CM | POA: Diagnosis not present

## 2021-03-20 DIAGNOSIS — E66811 Obesity, class 1: Secondary | ICD-10-CM | POA: Insufficient documentation

## 2021-03-20 DIAGNOSIS — E049 Nontoxic goiter, unspecified: Secondary | ICD-10-CM | POA: Diagnosis not present

## 2021-03-20 DIAGNOSIS — Z862 Personal history of diseases of the blood and blood-forming organs and certain disorders involving the immune mechanism: Secondary | ICD-10-CM | POA: Diagnosis not present

## 2021-04-04 DIAGNOSIS — Z0001 Encounter for general adult medical examination with abnormal findings: Secondary | ICD-10-CM | POA: Diagnosis not present

## 2021-04-04 DIAGNOSIS — E049 Nontoxic goiter, unspecified: Secondary | ICD-10-CM | POA: Diagnosis not present

## 2021-04-04 DIAGNOSIS — E6609 Other obesity due to excess calories: Secondary | ICD-10-CM | POA: Diagnosis not present

## 2021-04-04 DIAGNOSIS — Z862 Personal history of diseases of the blood and blood-forming organs and certain disorders involving the immune mechanism: Secondary | ICD-10-CM | POA: Diagnosis not present

## 2021-04-04 DIAGNOSIS — Z6834 Body mass index (BMI) 34.0-34.9, adult: Secondary | ICD-10-CM | POA: Diagnosis not present

## 2021-04-04 DIAGNOSIS — Z13220 Encounter for screening for lipoid disorders: Secondary | ICD-10-CM | POA: Diagnosis not present

## 2021-06-16 ENCOUNTER — Encounter: Payer: Self-pay | Admitting: *Deleted

## 2021-08-04 IMAGING — US US PELVIS COMPLETE
1 series · 15 of 25 positions shown · non-contrast
Comparison: None.

CLINICAL DATA: Post partum IUD insertion. Strings missing. Initial
encounter.

EXAM:
TRANSABDOMINAL ULTRASOUND OF PELVIS
TECHNIQUE: Transabdominal ultrasound examination of the pelvis was performed
including evaluation of the uterus, ovaries, adnexal regions, and
pelvic cul-de-sac.

[Series 1: us pelvis complete · 31 acquisitions, 15 frames shown]
[im 1/31]
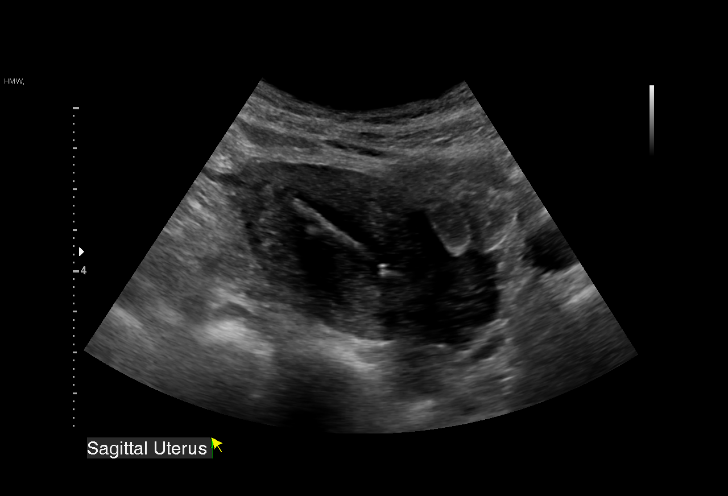
[im 3/31]
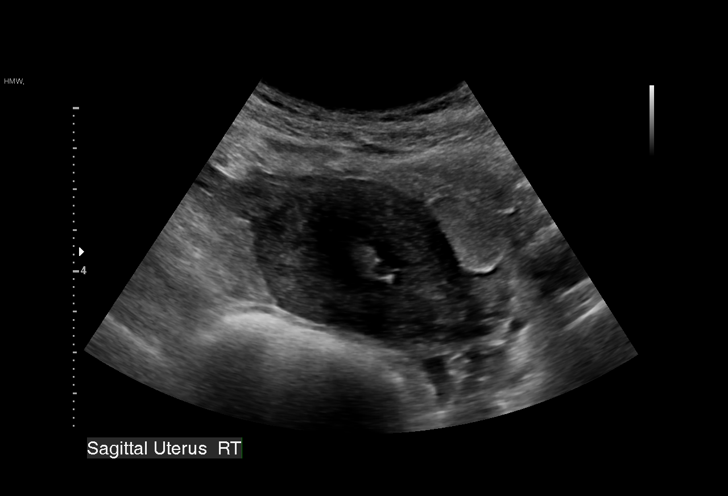
[im 6/31]
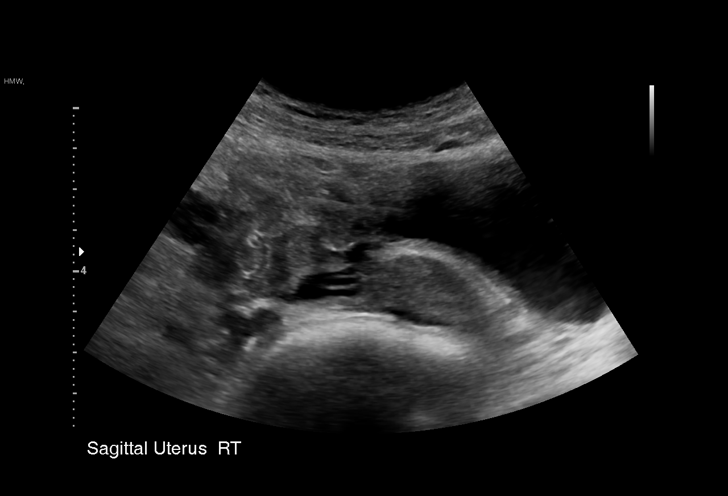
[im 7/31]
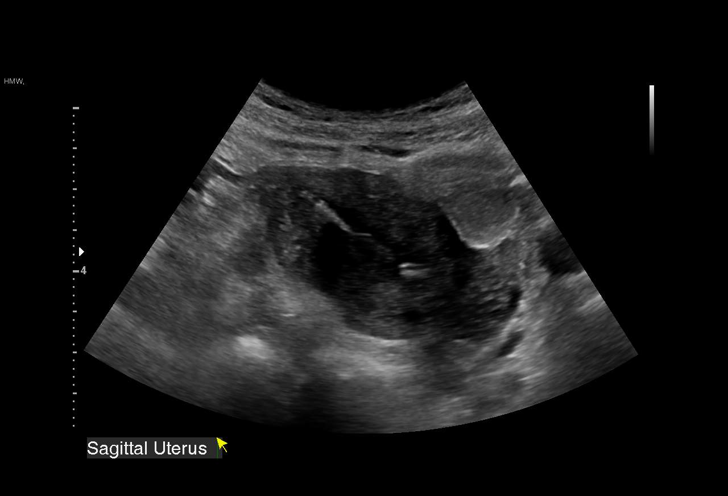
[im 9/31]
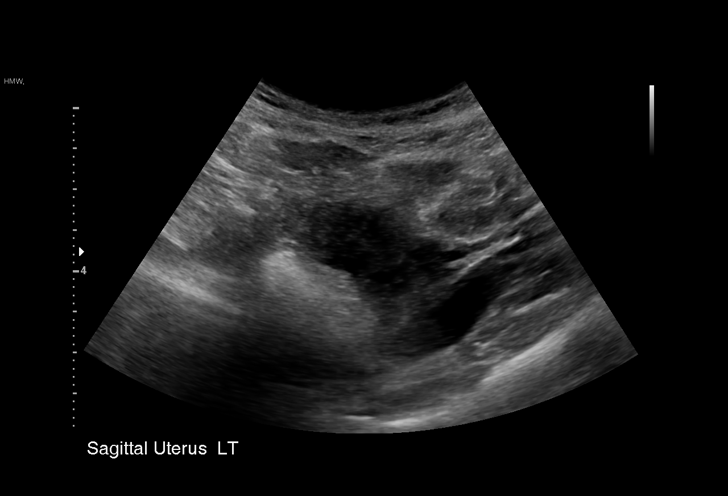
[im 12/31]
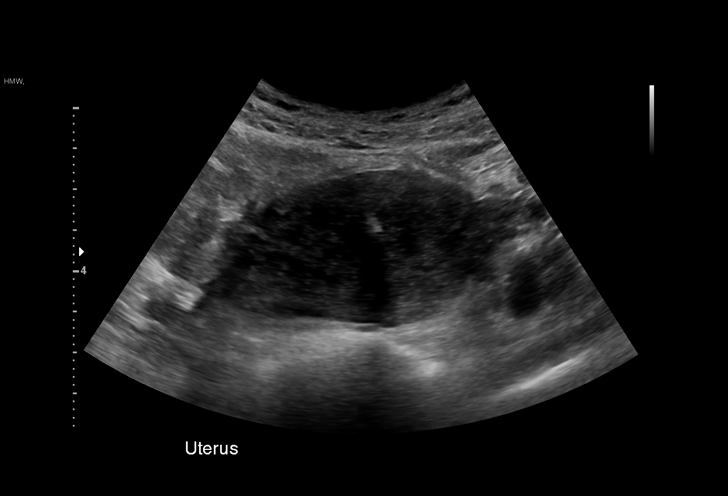
[im 13/31]
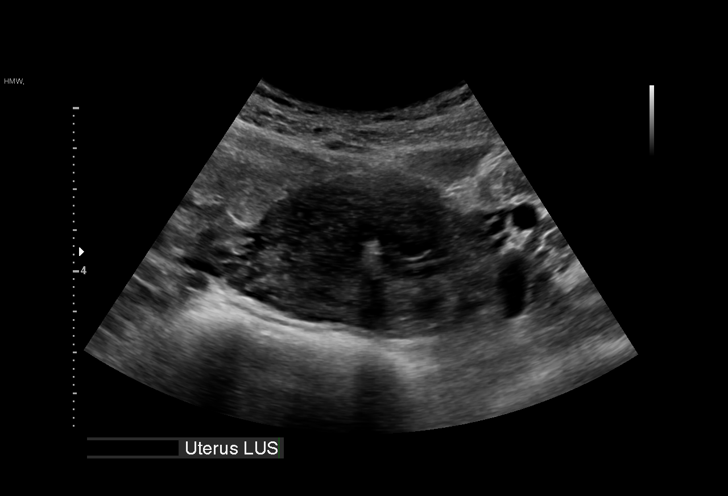
[im 16/31]
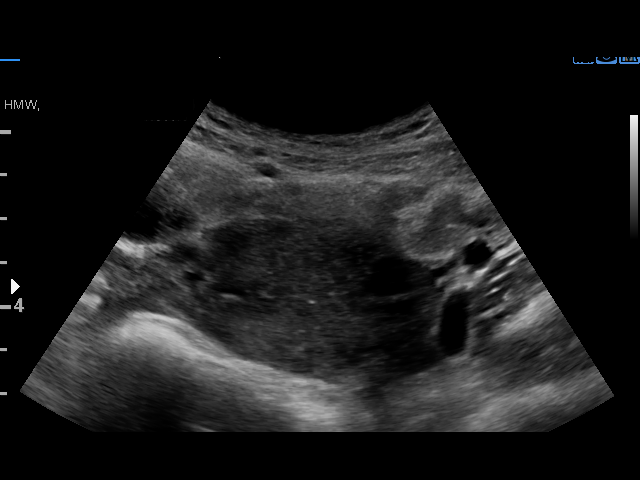
[im 18/31]
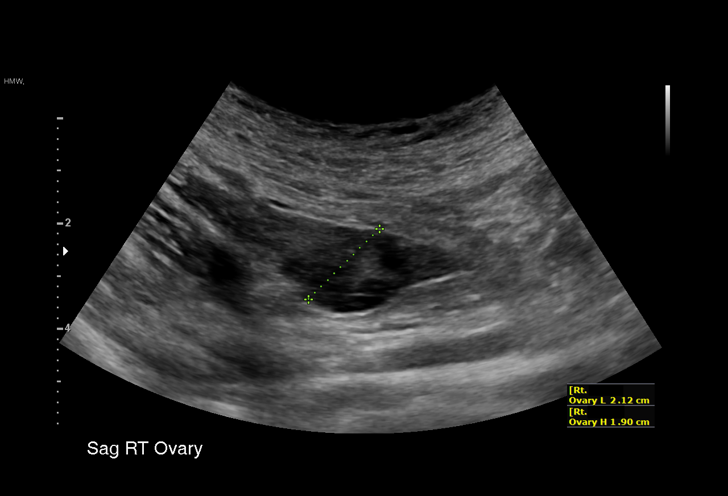
[im 19/31]
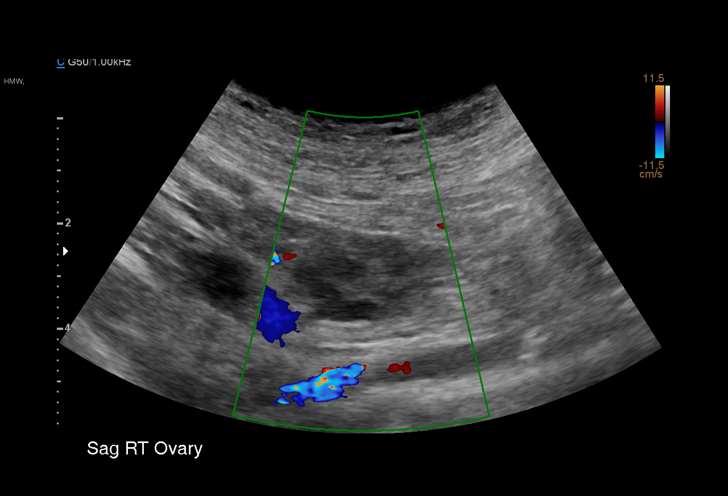
[im 22/31]
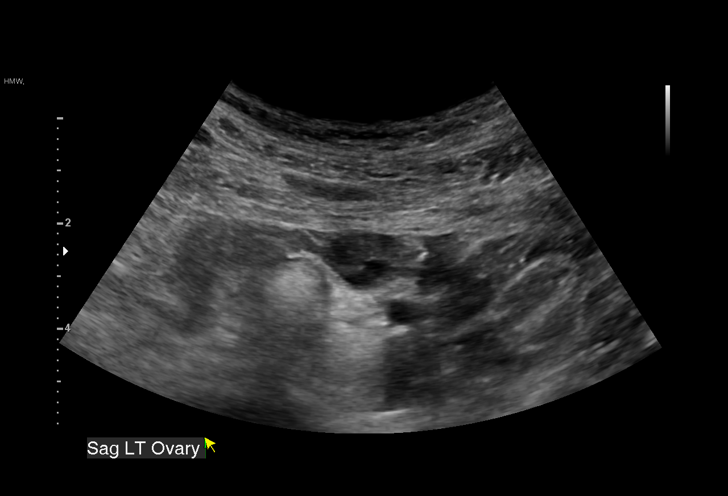
[im 24/31]
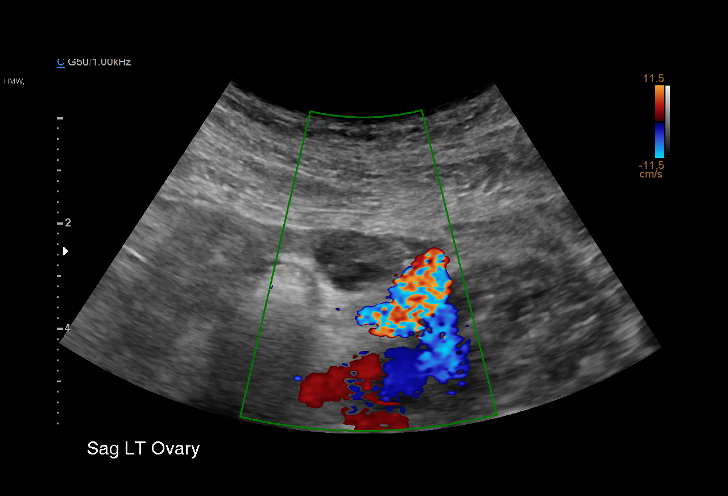
[im 26/31]
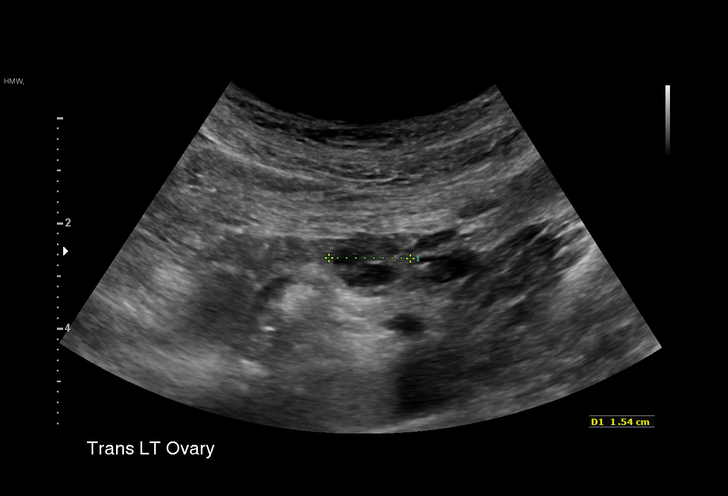
[im 28/31]
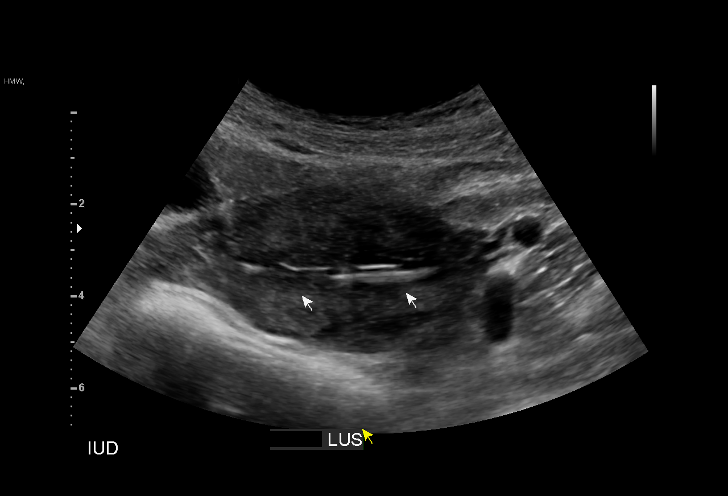
[im 31/31]
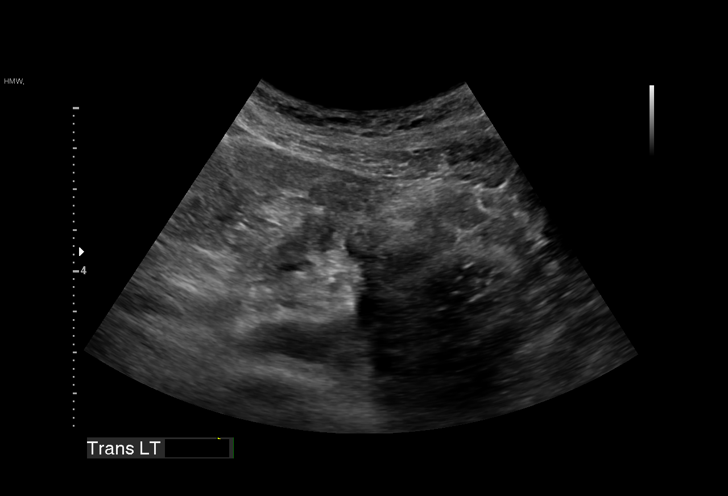

[15 of 25 positions shown; findings below may reference images not displayed]

FINDINGS: Uterus

Measurements: 7.2 x 3.8 x 6.2 cm = volume: 89 mL. No fibroids or
other mass visualized.

Endometrium

Thickness: Not well delineated given IUD in place, however appear
subcentimeter. Linear intrauterine device is present in the
endometrium, however the arms appear in the lower uterine segment,
with the stem extending towards the uterine fundus.

Right ovary

Measurements: 2.1 x 1.9 x 1.8 cm = volume: 3.9 mL. Normal
appearance/no adnexal mass.

Left ovary

Measurements: 1.5 x 1.4 x 1.7 cm = volume: 1.9 mL. Normal
appearance/no adnexal mass.

Other findings:  No abnormal free fluid.
IMPRESSION: 1. Intrauterine device in the endometrium, however appears
malposition and flipped with the arms in the lower uterine segment
and stem directed towards the fundus.
2. Normal sonographic appearance of the ovaries.

## 2021-08-19 ENCOUNTER — Ambulatory Visit (INDEPENDENT_AMBULATORY_CARE_PROVIDER_SITE_OTHER): Payer: Medicaid Other

## 2021-08-19 ENCOUNTER — Other Ambulatory Visit (HOSPITAL_COMMUNITY)
Admission: RE | Admit: 2021-08-19 | Discharge: 2021-08-19 | Disposition: A | Discharge status: Home / Self Care | Payer: Medicaid Other | Source: Ambulatory Visit | Attending: Obstetrics and Gynecology | Admitting: Obstetrics and Gynecology

## 2021-08-19 DIAGNOSIS — N898 Other specified noninflammatory disorders of vagina: Secondary | ICD-10-CM | POA: Insufficient documentation

## 2021-08-19 NOTE — Progress Notes (Signed)
..  SUBJECTIVE:  21 y.o. female complains of creamy vaginal discharge for 1 week(s). Denies abnormal vaginal bleeding or significant pelvic pain or fever. No UTI symptoms. Denies history of known exposure to STD.  No LMP recorded.  OBJECTIVE:  She appears well, afebrile. Urine dipstick: not done.  ASSESSMENT:  Vaginal Discharge  Vaginal Odor   PLAN:  GC, chlamydia, trichomonas, BVAG, CVAG probe sent to lab. Treatment: To be determined once lab results are received ROV prn if symptoms persist or worsen.

## 2021-08-20 LAB — HEPATITIS B SURFACE ANTIGEN: Hepatitis B Surface Ag: NEGATIVE

## 2021-08-20 LAB — CERVICOVAGINAL ANCILLARY ONLY
Bacterial Vaginitis (gardnerella): POSITIVE — AB
Candida Glabrata: NEGATIVE
Candida Vaginitis: NEGATIVE
Chlamydia: NEGATIVE
Comment: NEGATIVE
Comment: NEGATIVE
Comment: NEGATIVE
Comment: NEGATIVE
Comment: NEGATIVE
Comment: NORMAL
Neisseria Gonorrhea: NEGATIVE
Trichomonas: NEGATIVE

## 2021-08-20 LAB — HEPATITIS C ANTIBODY: Hep C Virus Ab: NONREACTIVE

## 2021-08-20 LAB — RPR: RPR Ser Ql: NONREACTIVE

## 2021-08-20 LAB — HIV ANTIBODY (ROUTINE TESTING W REFLEX): HIV Screen 4th Generation wRfx: NONREACTIVE

## 2021-08-21 ENCOUNTER — Other Ambulatory Visit: Payer: Self-pay | Admitting: *Deleted

## 2021-08-21 MED ORDER — METRONIDAZOLE 0.75 % VA GEL
1.0000 | Freq: Every day | VAGINAL | 0 refills | Status: DC
Start: 2021-08-21 — End: 2022-02-27

## 2021-08-21 NOTE — Progress Notes (Signed)
Metrogel sent for +BV See lab result

## 2021-10-14 ENCOUNTER — Ambulatory Visit: Payer: Medicaid Other

## 2021-11-25 DIAGNOSIS — Z6837 Body mass index (BMI) 37.0-37.9, adult: Secondary | ICD-10-CM | POA: Diagnosis not present

## 2021-11-25 DIAGNOSIS — Z4802 Encounter for removal of sutures: Secondary | ICD-10-CM | POA: Diagnosis not present

## 2022-01-27 ENCOUNTER — Ambulatory Visit: Payer: Medicaid Other

## 2022-02-17 ENCOUNTER — Ambulatory Visit (INDEPENDENT_AMBULATORY_CARE_PROVIDER_SITE_OTHER): Payer: Medicaid Other

## 2022-02-17 ENCOUNTER — Other Ambulatory Visit (HOSPITAL_COMMUNITY)
Admission: RE | Admit: 2022-02-17 | Discharge: 2022-02-17 | Disposition: A | Discharge status: Home / Self Care | Payer: Medicaid Other | Source: Ambulatory Visit

## 2022-02-17 VITALS — BP 116/79 | HR 71 | Ht 62.0 in | Wt 196.6 lb

## 2022-02-17 DIAGNOSIS — Z30432 Encounter for removal of intrauterine contraceptive device: Secondary | ICD-10-CM | POA: Diagnosis not present

## 2022-02-17 DIAGNOSIS — Z113 Encounter for screening for infections with a predominantly sexual mode of transmission: Secondary | ICD-10-CM | POA: Insufficient documentation

## 2022-02-17 DIAGNOSIS — T8332XD Displacement of intrauterine contraceptive device, subsequent encounter: Secondary | ICD-10-CM

## 2022-02-17 DIAGNOSIS — Z01419 Encounter for gynecological examination (general) (routine) without abnormal findings: Secondary | ICD-10-CM | POA: Insufficient documentation

## 2022-02-17 DIAGNOSIS — Z1239 Encounter for other screening for malignant neoplasm of breast: Secondary | ICD-10-CM

## 2022-02-17 NOTE — Progress Notes (Signed)
Pt presents for AEX and IUD removal. Pt requesting STD testing. Pt does not desire any form of BC at this time.

## 2022-02-17 NOTE — Progress Notes (Signed)
GYNECOLOGY OFFICE VISIT NOTE-WELL WOMAN EXAM  History:   Patricia Koch s a 22 year old [id number] here today for annual exam with IUD removal. She states she is getting left side abdominal pain that is relieved with rest, but worsened with standing.  She also reports some vaginal discharge that she consider to be BV.  She denies odor or itching. Patient is not currently sexually active.  .   Birth Control:  IUD, Desires Removal for "regulation."   Reproductive Concerns Sexually Active; not currently Partners Type: Female Number of partners in last year: One STD Testing: Full  Vaginal/GU Concerns: Breast Concerns/Exams: No issues or concerns. Patient reports her maternal great grandmother had breast cancer, unsure if death was due to this. Patient  reports her great cousin passed away from ovarian cancer.  She denies other known family history of breast, uterine, cervical, or ovarian cancer   Medical and Nutrition PCP: [facility]: Has appt next week Significant PMx: None Exercise: Goal is 5x/week.  She reports 2-3x/week.  Zumba 30 minutes Tobacco/Drugs/Alcohol: None Nutrition: Improving.   Social Safety at home: Endorses. Lives with self and kids.  DV/A: Denies Social Support: Endorses Employment: None School: [employer] May 2025  Past Medical History:  Diagnosis Date   Anemia    Headache    Pes planus     Past Surgical History:  Procedure Laterality Date   NOSE SURGERY     WISDOM TOOTH EXTRACTION      The following portions of the patient's history were reviewed and updated as appropriate: allergies, current medications, past family history, past medical history, past social history, past surgical history and problem list.   Health Maintenance:  Normal pap and negative HRHPV on Oct 2022.  No mammogram on file d/t age.   Review of Systems:  Pertinent items noted in HPI and remainder of comprehensive ROS otherwise negative.     Objective:    Physical Exam BP 116/79   Pulse 71   Ht 5\' 2"  (1.575 m)   Wt 196 lb 9.6 oz (89.2 kg)   BMI 35.96 kg/m  Physical Exam Vitals reviewed. Exam conducted with a chaperone present.  Constitutional:      Appearance: Normal appearance.  HENT:     Head: Normocephalic and atraumatic.  Eyes:     Conjunctiva/sclera: Conjunctivae normal.  Cardiovascular:     Rate and Rhythm: Normal rate and regular rhythm.     Heart sounds: Normal heart sounds.  Pulmonary:     Effort: Pulmonary effort is normal.     Breath sounds: Normal breath sounds.  Chest:  Breasts:    Right: No inverted nipple, mass, nipple discharge, skin change or tenderness.     Left: No inverted nipple, mass, nipple discharge, skin change or tenderness.     Comments: CBE completed and normal.  Abdominal:     General: Bowel sounds are normal.     Palpations: Abdomen is soft.     Tenderness: There is no abdominal tenderness.  Genitourinary:    Labia:        Right: No tenderness or lesion.        Left: No tenderness or lesion.      Vagina: No erythema or bleeding.     Cervix: No cervical motion tenderness, discharge, lesion, erythema or cervical bleeding.     Uterus: Not enlarged and not tender.      Comments: Vaginal walls pink and with good rugae. Scant amt vaginal  discharge; no apparent odor. CV collected. Cervix pink and parous.  No apparent strings from os.   Musculoskeletal:        General: Normal range of motion.     Cervical back: Normal range of motion.  Skin:    General: Skin is warm and dry.  Neurological:     Mental Status: She is alert and oriented to person, place, and time.  Psychiatric:        Mood and Affect: Mood normal.        Behavior: Behavior normal.      IUD Removal Attempt Patient identified, informed consent performed, consent signed.  Patient was in the dorsal lithotomy position, normal external genitalia was noted.  A speculum was placed in the patient's vagina, normal  discharge was noted, no lesions. The cervix was visualized, no lesions, no abnormal discharge. The cervix was cleaned with betadine x 2. The strings of the IUD were not visualized, so string retriever were introduced into the cervix x 2 without success. Then [employer] were introduced, x 1, into LUS without ability to grasp IUD.  Patient tolerated the procedure well.     Labs and Imaging No results found for this or any previous visit (from the past 168 hour(s)). No results found.   Assessment & Plan:  22 year old Female Well Woman Exam  Breast Exam Desires STD Screening Desires IUD Removal  1. Well woman exam with routine gynecological exam -Exam performed and findings discussed. -Educated on ASCCP guidelines regarding pap smear evaluation and frequency. -Informed that although previous pap was ASCUS, d/t negative HPV no need to repeat until next year.  Patient agreeable.  -Commended on initiation of exercise regimen. Encouraged to increase gradually to [facility] recommendation.   - Cervicovaginal ancillary only( [place]) - HIV antibody (with reflex) - RPR - Hepatitis C Antibody - Hepatitis B Surface AntiGEN  2. Encounter for removal of intrauterine contraceptive device (IUD) -IUD strings not visualized. -Known malposition as per April 2022 [place].  -Reviewed with Dr. Marcos Eke who recommends repeat US and virtual visit to schedule for surgical removal. -Patient agreeable and scheduled for pelvic US on Monday Feb 12th.   3. Screening examination for STD (sexually transmitted disease) -CV collected. -Labs ordered -Will treat accordingly - Cervicovaginal ancillary only( [place]) - HIV antibody (with reflex) - RPR - Hepatitis C Antibody - Hepatitis B Surface AntiGEN   5. Screening breast examination -Educated and encouraged to continue monthly SBE with increased breast awareness including examination of breast for skin changes, moles, tenderness, etc.   Routine  preventative health maintenance measures emphasized. Please refer to After Visit Summary for other counseling recommendations.   No follow-ups on file.      Maryann Conners, CNM 02/17/2022

## 2022-02-18 LAB — HEPATITIS B SURFACE ANTIGEN: Hepatitis B Surface Ag: NEGATIVE

## 2022-02-18 LAB — RPR: RPR Ser Ql: NONREACTIVE

## 2022-02-18 LAB — HEPATITIS C ANTIBODY: Hep C Virus Ab: NONREACTIVE

## 2022-02-18 LAB — HIV ANTIBODY (ROUTINE TESTING W REFLEX): HIV Screen 4th Generation wRfx: NONREACTIVE

## 2022-02-19 LAB — CERVICOVAGINAL ANCILLARY ONLY
Bacterial Vaginitis (gardnerella): POSITIVE — AB
Candida Glabrata: NEGATIVE
Candida Vaginitis: NEGATIVE
Chlamydia: NEGATIVE
Comment: NEGATIVE
Comment: NEGATIVE
Comment: NEGATIVE
Comment: NEGATIVE
Comment: NEGATIVE
Comment: NORMAL
Neisseria Gonorrhea: NEGATIVE
Trichomonas: NEGATIVE

## 2022-02-22 ENCOUNTER — Ambulatory Visit (HOSPITAL_BASED_OUTPATIENT_CLINIC_OR_DEPARTMENT_OTHER)
Admission: RE | Admit: 2022-02-22 | Discharge: 2022-02-22 | Disposition: A | Discharge status: Home / Self Care | Payer: Medicaid Other | Source: Ambulatory Visit

## 2022-02-22 DIAGNOSIS — Z01419 Encounter for gynecological examination (general) (routine) without abnormal findings: Secondary | ICD-10-CM | POA: Diagnosis not present

## 2022-02-22 DIAGNOSIS — T8332XD Displacement of intrauterine contraceptive device, subsequent encounter: Secondary | ICD-10-CM

## 2022-02-22 DIAGNOSIS — Z30430 Encounter for insertion of intrauterine contraceptive device: Secondary | ICD-10-CM | POA: Diagnosis not present

## 2022-02-23 DIAGNOSIS — Z6835 Body mass index (BMI) 35.0-35.9, adult: Secondary | ICD-10-CM | POA: Diagnosis not present

## 2022-02-23 DIAGNOSIS — Z0000 Encounter for general adult medical examination without abnormal findings: Secondary | ICD-10-CM | POA: Diagnosis not present

## 2022-02-23 DIAGNOSIS — E6609 Other obesity due to excess calories: Secondary | ICD-10-CM | POA: Diagnosis not present

## 2022-02-27 MED ORDER — METRONIDAZOLE 0.75 % VA GEL: 1.0000 | Freq: Every day | VAGINAL | 0 refills | Status: DC

## 2022-02-27 NOTE — Addendum Note (Signed)
Addended by: Gavin Pound L on: 02/27/2022 04:12 AM   Modules accepted: Orders

## 2022-03-04 ENCOUNTER — Encounter: Payer: Self-pay | Admitting: Obstetrics and Gynecology

## 2022-03-04 ENCOUNTER — Ambulatory Visit: Payer: Medicaid Other | Admitting: Obstetrics and Gynecology

## 2022-03-04 VITALS — BP 110/75 | HR 85 | Ht 63.0 in | Wt 198.0 lb

## 2022-03-04 DIAGNOSIS — T8332XD Displacement of intrauterine contraceptive device, subsequent encounter: Secondary | ICD-10-CM | POA: Diagnosis not present

## 2022-03-04 NOTE — Progress Notes (Signed)
Patricia Koch presents for follow up after GYN U/S for lost IUD strings See prior office notes. GYN U/S reviewed with pt. IUD in place but appears to be upside down  Hysteroscopic removal recommended to pt.   PE AF VSS Lungs clear Heart RRR Abd soft + BS  A/P Lost IUD strings  Hysteroscopic removal of IUD reviewed with pt. Information provided as well Will schedule, F/U with post op appt

## 2022-03-04 NOTE — Progress Notes (Signed)
Consult IUD removal. Per [place] report, IUD may be inverted in uterus.

## 2022-04-28 NOTE — H&P (Addendum)
Patricia Koch is an 22 y.o. female with lost IUD strings. GYN U/S confirms IUD in place but IUD appears to be upside down. Pt here today for hysteroscopic removal.   Menstrual History: Menarche age: 22 No LMP recorded. (Menstrual status: IUD).    Past Medical History:  Diagnosis Date   Anemia    Headache    Pes planus     Past Surgical History:  Procedure Laterality Date   NOSE SURGERY     WISDOM TOOTH EXTRACTION      Family History  Problem Relation Age of Onset   Miscarriages / [place] Mother    Diabetes Father    Cancer Maternal Grandmother    Diabetes Maternal Grandmother    Hypertension Maternal Grandmother    Cancer Paternal Grandmother     Social History:  reports that she has never smoked. She has never used smokeless tobacco. She reports that she does not currently use alcohol. She reports that she does not use drugs.  Allergies: No Known Allergies  No medications prior to admission.    Review of Systems  Constitutional: Negative.   Respiratory: Negative.    Cardiovascular: Negative.   Gastrointestinal: Negative.   Genitourinary: Negative.     unknown if currently breastfeeding. Physical Exam Constitutional:      Appearance: Normal appearance.  Cardiovascular:     Rate and Rhythm: Normal rate and regular rhythm.  Pulmonary:     Effort: Pulmonary effort is normal.     Breath sounds: Normal breath sounds.  Abdominal:     General: Bowel sounds are normal.     Palpations: Abdomen is soft.  Genitourinary:    Comments: Nl EGBUS, normal appearing cervix, no IUD strings visible, uterus small , mobile no masses Neurological:     Mental Status: She is alert.     No results found for this or any previous visit (from the past 24 hour(s)).  No results found.  Assessment/Plan: Lost IUD strings  Hysteroscopic removal of IUD. R/B/Post op care have been reviewed with pt. Py has verbalized understanding and agrees to proceed  Hermina Staggers 04/28/2022, 10:00 PM

## 2022-04-30 ENCOUNTER — Encounter (HOSPITAL_COMMUNITY): Payer: Self-pay | Admitting: Obstetrics and Gynecology

## 2022-04-30 ENCOUNTER — Other Ambulatory Visit: Payer: Self-pay

## 2022-05-04 ENCOUNTER — Other Ambulatory Visit: Payer: Self-pay

## 2022-05-04 ENCOUNTER — Encounter (HOSPITAL_COMMUNITY)
Admission: RE | Disposition: A | Discharge status: Home / Self Care | Payer: Self-pay | Source: Home / Self Care | Attending: Obstetrics and Gynecology

## 2022-05-04 ENCOUNTER — Encounter (HOSPITAL_COMMUNITY): Payer: Self-pay | Admitting: Obstetrics and Gynecology

## 2022-05-04 ENCOUNTER — Ambulatory Visit (HOSPITAL_COMMUNITY): Payer: Medicaid Other | Admitting: Certified Registered Nurse Anesthetist

## 2022-05-04 ENCOUNTER — Ambulatory Visit (HOSPITAL_COMMUNITY)
Admission: RE | Admit: 2022-05-04 | Discharge: 2022-05-04 | Disposition: A | Discharge status: Home / Self Care | Payer: Medicaid Other | Attending: Obstetrics and Gynecology | Admitting: Obstetrics and Gynecology

## 2022-05-04 ENCOUNTER — Ambulatory Visit (HOSPITAL_BASED_OUTPATIENT_CLINIC_OR_DEPARTMENT_OTHER): Payer: Medicaid Other | Admitting: Certified Registered Nurse Anesthetist

## 2022-05-04 DIAGNOSIS — Z6831 Body mass index (BMI) 31.0-31.9, adult: Secondary | ICD-10-CM | POA: Diagnosis not present

## 2022-05-04 DIAGNOSIS — Y768 Miscellaneous obstetric and gynecological devices associated with adverse incidents, not elsewhere classified: Secondary | ICD-10-CM | POA: Diagnosis not present

## 2022-05-04 DIAGNOSIS — T8332XA Displacement of intrauterine contraceptive device, initial encounter: Secondary | ICD-10-CM

## 2022-05-04 DIAGNOSIS — D649 Anemia, unspecified: Secondary | ICD-10-CM

## 2022-05-04 DIAGNOSIS — T8332XD Displacement of intrauterine contraceptive device, subsequent encounter: Secondary | ICD-10-CM

## 2022-05-04 DIAGNOSIS — E669 Obesity, unspecified: Secondary | ICD-10-CM

## 2022-05-04 DIAGNOSIS — Z30432 Encounter for removal of intrauterine contraceptive device: Secondary | ICD-10-CM | POA: Diagnosis not present

## 2022-05-04 HISTORY — PX: IUD REMOVAL: SHX5392

## 2022-05-04 LAB — CBC
HCT: 40 % (ref 36.0–46.0)
Hemoglobin: 13.6 g/dL (ref 12.0–15.0)
MCH: 30.1 pg (ref 26.0–34.0)
MCHC: 34 g/dL (ref 30.0–36.0)
MCV: 88.5 fL (ref 80.0–100.0)
Platelets: 255 10*3/uL (ref 150–400)
RBC: 4.52 MIL/uL (ref 3.87–5.11)
RDW: 12.3 % (ref 11.5–15.5)
WBC: 7.6 10*3/uL (ref 4.0–10.5)
nRBC: 0 % (ref 0.0–0.2)

## 2022-05-04 LAB — POCT PREGNANCY, URINE: Preg Test, Ur: NEGATIVE

## 2022-05-04 SURGERY — REMOVAL, INTRAUTERINE DEVICE
Anesthesia: General | Site: Uterus

## 2022-05-04 MED ORDER — PROMETHAZINE HCL 25 MG/ML IJ SOLN: 6.2500 mg | INTRAMUSCULAR | Status: DC | PRN

## 2022-05-04 MED ORDER — 0.9 % SODIUM CHLORIDE (POUR BTL) OPTIME
TOPICAL | Status: DC | PRN
  Administered 2022-05-04: 1000 mL

## 2022-05-04 MED ORDER — PROPOFOL 10 MG/ML IV BOLUS
INTRAVENOUS | Status: AC
  Filled 2022-05-04: qty 20

## 2022-05-04 MED ORDER — FENTANYL CITRATE (PF) 100 MCG/2ML IJ SOLN: 25.0000 ug | INTRAMUSCULAR | Status: DC | PRN

## 2022-05-04 MED ORDER — GABAPENTIN 300 MG PO CAPS
300.0000 mg | ORAL_CAPSULE | Freq: Once | ORAL | Status: AC
  Administered 2022-05-04: 300 mg via ORAL
  Filled 2022-05-04: qty 1

## 2022-05-04 MED ORDER — POVIDONE-IODINE 10 % EX SWAB
2.0000 | Freq: Once | CUTANEOUS | Status: AC
  Administered 2022-05-04: 2 via TOPICAL

## 2022-05-04 MED ORDER — AMISULPRIDE (ANTIEMETIC) 5 MG/2ML IV SOLN: 10.0000 mg | Freq: Once | INTRAVENOUS | Status: DC | PRN

## 2022-05-04 MED ORDER — LIDOCAINE 2% (20 MG/ML) 5 ML SYRINGE
INTRAMUSCULAR | Status: AC
  Filled 2022-05-04: qty 5

## 2022-05-04 MED ORDER — KETOROLAC TROMETHAMINE 30 MG/ML IJ SOLN: 30.0000 mg | INTRAMUSCULAR | Status: DC

## 2022-05-04 MED ORDER — OXYCODONE HCL 5 MG/5ML PO SOLN: 5.0000 mg | Freq: Once | ORAL | Status: DC | PRN

## 2022-05-04 MED ORDER — KETOROLAC TROMETHAMINE 15 MG/ML IJ SOLN
INTRAMUSCULAR | Status: DC | PRN
  Administered 2022-05-04: 15 mg via INTRAVENOUS

## 2022-05-04 MED ORDER — OXYCODONE HCL 5 MG PO TABS: 5.0000 mg | ORAL_TABLET | Freq: Once | ORAL | Status: DC | PRN

## 2022-05-04 MED ORDER — ONDANSETRON HCL 4 MG/2ML IJ SOLN
INTRAMUSCULAR | Status: AC
  Filled 2022-05-04: qty 2

## 2022-05-04 MED ORDER — FENTANYL CITRATE (PF) 250 MCG/5ML IJ SOLN
INTRAMUSCULAR | Status: AC
  Filled 2022-05-04: qty 5

## 2022-05-04 MED ORDER — KETOROLAC TROMETHAMINE 30 MG/ML IJ SOLN
INTRAMUSCULAR | Status: AC
  Filled 2022-05-04: qty 1

## 2022-05-04 MED ORDER — DOXYCYCLINE HYCLATE 100 MG IV SOLR
200.0000 mg | INTRAVENOUS | Status: AC
  Administered 2022-05-04: 200 mg via INTRAVENOUS
  Filled 2022-05-04: qty 200

## 2022-05-04 MED ORDER — MEPERIDINE HCL 25 MG/ML IJ SOLN: 6.2500 mg | INTRAMUSCULAR | Status: DC | PRN

## 2022-05-04 MED ORDER — PROPOFOL 10 MG/ML IV BOLUS
INTRAVENOUS | Status: DC | PRN
  Administered 2022-05-04: 140 mg via INTRAVENOUS

## 2022-05-04 MED ORDER — ACETAMINOPHEN 500 MG PO TABS
1000.0000 mg | ORAL_TABLET | Freq: Once | ORAL | Status: DC
  Filled 2022-05-04: qty 2

## 2022-05-04 MED ORDER — KETOROLAC TROMETHAMINE 15 MG/ML IJ SOLN
INTRAMUSCULAR | Status: AC
  Administered 2022-05-04: 15 mg
  Filled 2022-05-04: qty 1

## 2022-05-04 MED ORDER — LACTATED RINGERS IV SOLN: INTRAVENOUS | Status: DC

## 2022-05-04 MED ORDER — DEXAMETHASONE SODIUM PHOSPHATE 10 MG/ML IJ SOLN
INTRAMUSCULAR | Status: DC | PRN
  Administered 2022-05-04: 10 mg via INTRAVENOUS

## 2022-05-04 MED ORDER — OXYCODONE HCL 5 MG PO TABS
5.0000 mg | ORAL_TABLET | Freq: Four times a day (QID) | ORAL | 0 refills | Status: DC | PRN
Start: 2022-05-04 — End: 2022-09-15

## 2022-05-04 MED ORDER — ROCURONIUM BROMIDE 10 MG/ML (PF) SYRINGE
PREFILLED_SYRINGE | INTRAVENOUS | Status: AC
  Filled 2022-05-04: qty 10

## 2022-05-04 MED ORDER — CHLORHEXIDINE GLUCONATE 0.12 % MT SOLN
15.0000 mL | Freq: Once | OROMUCOSAL | Status: AC
  Administered 2022-05-04: 15 mL via OROMUCOSAL
  Filled 2022-05-04: qty 15

## 2022-05-04 MED ORDER — DEXAMETHASONE SODIUM PHOSPHATE 10 MG/ML IJ SOLN
INTRAMUSCULAR | Status: AC
  Filled 2022-05-04: qty 1

## 2022-05-04 MED ORDER — ONDANSETRON HCL 4 MG/2ML IJ SOLN
INTRAMUSCULAR | Status: DC | PRN
  Administered 2022-05-04: 4 mg via INTRAVENOUS

## 2022-05-04 MED ORDER — MIDAZOLAM HCL 2 MG/2ML IJ SOLN
INTRAMUSCULAR | Status: AC
  Filled 2022-05-04: qty 2

## 2022-05-04 MED ORDER — LIDOCAINE 2% (20 MG/ML) 5 ML SYRINGE
INTRAMUSCULAR | Status: DC | PRN
  Administered 2022-05-04: 60 mg via INTRAVENOUS

## 2022-05-04 MED ORDER — SCOPOLAMINE 1 MG/3DAYS TD PT72
1.0000 | MEDICATED_PATCH | TRANSDERMAL | Status: DC
  Administered 2022-05-04: 1.5 mg via TRANSDERMAL
  Filled 2022-05-04: qty 1

## 2022-05-04 MED ORDER — ORAL CARE MOUTH RINSE: 15.0000 mL | Freq: Once | OROMUCOSAL | Status: AC

## 2022-05-04 MED ORDER — FERRIC SUBSULFATE 259 MG/GM EX SOLN
CUTANEOUS | Status: AC
  Filled 2022-05-04: qty 8

## 2022-05-04 MED ORDER — SOD CITRATE-CITRIC ACID 500-334 MG/5ML PO SOLN
30.0000 mL | ORAL | Status: AC
  Administered 2022-05-04: 30 mL via ORAL
  Filled 2022-05-04: qty 30

## 2022-05-04 MED ORDER — MONSELS FERRIC SUBSULFATE EX SOLN
CUTANEOUS | Status: DC | PRN
  Administered 2022-05-04: 1 via TOPICAL

## 2022-05-04 MED ORDER — ACETAMINOPHEN 500 MG PO TABS
1000.0000 mg | ORAL_TABLET | ORAL | Status: AC
  Administered 2022-05-04: 1000 mg via ORAL

## 2022-05-04 MED ORDER — MIDAZOLAM HCL 2 MG/2ML IJ SOLN
INTRAMUSCULAR | Status: DC | PRN
  Administered 2022-05-04: 2 mg via INTRAVENOUS

## 2022-05-04 SURGICAL SUPPLY — 13 items
CATH ROBINSON RED A/P 16FR (CATHETERS) ×2 IMPLANT
ELECT REM PT RETURN 9FT ADLT (ELECTROSURGICAL)
ELECTRODE REM PT RTRN 9FT ADLT (ELECTROSURGICAL) IMPLANT
GLOVE BIO SURGEON STRL SZ7.5 (GLOVE) ×2 IMPLANT
GLOVE SURG UNDER POLY LF SZ7 (GLOVE) ×2 IMPLANT
GOWN STRL REUS W/ TWL LRG LVL3 (GOWN DISPOSABLE) ×2 IMPLANT
GOWN STRL REUS W/ TWL XL LVL3 (GOWN DISPOSABLE) ×2 IMPLANT
GOWN STRL REUS W/TWL LRG LVL3 (GOWN DISPOSABLE) ×2
GOWN STRL REUS W/TWL XL LVL3 (GOWN DISPOSABLE) ×2
KIT PROCEDURE FLUENT (KITS) ×1 IMPLANT
PACK VAGINAL MINOR WOMEN LF (CUSTOM PROCEDURE TRAY) ×2 IMPLANT
PAD OB MATERNITY 4.3X12.25 (PERSONAL CARE ITEMS) ×2 IMPLANT
TOWEL GREEN STERILE FF (TOWEL DISPOSABLE) ×4 IMPLANT

## 2022-05-04 NOTE — Transfer of Care (Signed)
Immediate Anesthesia Transfer of Care Note  Patient: Patricia Koch  Procedure(s) Performed: REMOVAL OF RETAINED INTRAUTERINE DEVICE (Uterus)  Patient Location: PACU  Anesthesia Type:General  Level of Consciousness: drowsy, patient cooperative, and responds to stimulation  Airway & Oxygen Therapy: Patient Spontanous Breathing and Patient connected to face mask oxygen  Post-op Assessment: Report given to RN, Post -op Vital signs reviewed and stable, Patient moving all extremities X 4, and Patient able to stick tongue midline  Post vital signs: Reviewed  Last Vitals:  Vitals Value Taken Time  BP 105/67 05/04/22 1049  Temp 36.2 C 05/04/22 1049  Pulse 74 05/04/22 1049  Resp 13 05/04/22 1051  SpO2 100 % 05/04/22 1049  Vitals shown include unvalidated device data.  Last Pain:  Vitals:   05/04/22 1049  TempSrc:   PainSc: Asleep         Complications: No notable events documented.

## 2022-05-04 NOTE — Anesthesia Procedure Notes (Signed)
Procedure Name: LMA Insertion Date/Time: 05/04/2022 10:22 AM  Performed by: Cy Blamer, CRNAPre-anesthesia Checklist: Patient identified, Emergency Drugs available, Suction available, Patient being monitored and Timeout performed Patient Re-evaluated:Patient Re-evaluated prior to induction Oxygen Delivery Method: Circle system utilized Preoxygenation: Pre-oxygenation with 100% oxygen LMA: LMA inserted LMA Size: 4.0 Placement Confirmation: positive ETCO2 Tube secured with: Tape Dental Injury: Teeth and Oropharynx as per pre-operative assessment

## 2022-05-04 NOTE — Interval H&P Note (Signed)
History and Physical Interval Note:  05/04/2022 9:45 AM  [name]  has presented today for surgery, with the diagnosis of Missing Stings.  The various methods of treatment have been discussed with the patient and family. After consideration of risks, benefits and other options for treatment, the patient has consented to  Procedure(s) with comments: DILATATION AND CURETTAGE /HYSTEROSCOPY (N/A) - rep will be here confirmed on 4/15 CS as a surgical intervention.  The patient's history has been reviewed, patient examined, no change in status, stable for surgery.  I have reviewed the patient's chart and labs.  Questions were answered to the patient's satisfaction.     Patricia Koch

## 2022-05-04 NOTE — Anesthesia Preprocedure Evaluation (Addendum)
Anesthesia Evaluation  Patient identified by MRN, date of birth, ID band Patient awake    Reviewed: Allergy & Precautions, NPO status , Patient's Chart, lab work & pertinent test results  Airway Mallampati: II  TM Distance: >3 FB Neck ROM: Full    Dental no notable dental hx. (+) Dental Advisory Given, Teeth Intact   Pulmonary neg pulmonary ROS   Pulmonary exam normal breath sounds clear to auscultation       Cardiovascular negative cardio ROS Normal cardiovascular exam Rhythm:Regular Rate:Normal     Neuro/Psych  Headaches    GI/Hepatic negative GI ROS, Neg liver ROS,,,  Endo/Other  negative endocrine ROS    Renal/GU negative Renal ROS     Musculoskeletal negative musculoskeletal ROS (+)    Abdominal  (+) + obese  Peds  Hematology  (+) Blood dyscrasia, anemia   Anesthesia Other Findings   Reproductive/Obstetrics                             Anesthesia Physical Anesthesia Plan  ASA: 2  Anesthesia Plan: General   Post-op Pain Management: Tylenol PO (pre-op)* and Gabapentin PO (pre-op)*   Induction: Intravenous  PONV Risk Score and Plan: 4 or greater and Dexamethasone, Ondansetron, Midazolam, Treatment may vary due to age or medical condition and Scopolamine patch - Pre-op  Airway Management Planned: LMA  Additional Equipment:   Intra-op Plan:   Post-operative Plan: Extubation in OR  Informed Consent: I have reviewed the patients History and Physical, chart, labs and discussed the procedure including the risks, benefits and alternatives for the proposed anesthesia with the patient or authorized representative who has indicated his/her understanding and acceptance.     Dental advisory given  Plan Discussed with: CRNA  Anesthesia Plan Comments:         Anesthesia Quick Evaluation

## 2022-05-04 NOTE — Anesthesia Postprocedure Evaluation (Signed)
Anesthesia Post Note  Patient: Patricia Koch  Procedure(s) Performed: REMOVAL OF RETAINED INTRAUTERINE DEVICE (Uterus)     Patient location during evaluation: PACU Anesthesia Type: General Level of consciousness: sedated and patient cooperative Pain management: pain level controlled Vital Signs Assessment: post-procedure vital signs reviewed and stable Respiratory status: spontaneous breathing Cardiovascular status: stable Anesthetic complications: no   No notable events documented.  Last Vitals:  Vitals:   05/04/22 1115 05/04/22 1130  BP: 110/75 106/76  Pulse: 75 76  Resp: 13 16  Temp:  (!) 36.3 C  SpO2: 100% 100%    Last Pain:  Vitals:   05/04/22 1130  TempSrc:   PainSc: 0-No pain                 Lewie Loron

## 2022-05-04 NOTE — Op Note (Signed)
    GYNECOLOGY CLINIC PROCEDURE NOTE  Patricia Koch is a 22 y.o. [id number] here for Mirena IUD removal. No GYN concerns.  IUD Removal  Patient identified, informed consent performed, consent signed.  Patient was in the dorsal lithotomy position, normal external genitalia was noted.  A speculum was placed in the patient's vagina, normal discharge was noted, no lesions. The cervix was visualized, no lesions, no abnormal discharge. The strings of the IUD were not visualized, so an IUD hook  was introduced into the endometrial cavity and the IUD was hooked and removed in its entirety.  Patient tolerated the procedure well.     Nettie Elm, MD, FACOG Attending Obstetrician & Gynecologist Center for [facility], [facility]

## 2022-05-05 ENCOUNTER — Encounter (HOSPITAL_COMMUNITY): Payer: Self-pay | Admitting: Obstetrics and Gynecology

## 2022-08-09 ENCOUNTER — Ambulatory Visit: Payer: Medicaid Other

## 2022-09-15 ENCOUNTER — Ambulatory Visit (INDEPENDENT_AMBULATORY_CARE_PROVIDER_SITE_OTHER): Payer: Medicaid Other | Admitting: Obstetrics and Gynecology

## 2022-09-15 ENCOUNTER — Other Ambulatory Visit (HOSPITAL_COMMUNITY)
Admission: RE | Admit: 2022-09-15 | Discharge: 2022-09-15 | Disposition: A | Discharge status: Home / Self Care | Payer: Medicaid Other | Source: Ambulatory Visit | Attending: Obstetrics and Gynecology | Admitting: Obstetrics and Gynecology

## 2022-09-15 ENCOUNTER — Encounter: Payer: Self-pay | Admitting: Obstetrics and Gynecology

## 2022-09-15 VITALS — BP 105/73 | HR 66 | Ht 62.0 in | Wt 198.0 lb

## 2022-09-15 DIAGNOSIS — Z01419 Encounter for gynecological examination (general) (routine) without abnormal findings: Secondary | ICD-10-CM | POA: Insufficient documentation

## 2022-09-15 DIAGNOSIS — Z1339 Encounter for screening examination for other mental health and behavioral disorders: Secondary | ICD-10-CM | POA: Diagnosis not present

## 2022-09-15 NOTE — Progress Notes (Signed)
ANNUAL EXAM Patient name: Patricia Koch MRN [id number]  Date of birth: 2000-10-20 Chief Complaint:   Gynecologic Exam  History of Present Illness:   Patricia Koch is a 22 y.o. [phone] with Patient's last menstrual period was 08/23/2022 (exact date). being seen today for a routine annual exam.  Current complaints: None   Upstream - 09/15/22 1520       Pregnancy Intention Screening   Does the patient want to become pregnant in the next year? Unsure    Does the patient's partner want to become pregnant in the next year? Unsure    Would the patient like to discuss contraceptive options today? No      Contraception Wrap Up   Current Method No Contraceptive Precautions            The pregnancy intention screening data noted above was reviewed. Potential methods of contraception were discussed. The patient elected to proceed with No Method - Other Reason.   Last pap 10/23/20. Results were: ASCUS w/ HRHPV negative. H/O abnormal pap: yes LSIL 10/11/19 & ASCUS as above Last mammogram: n/a. Results were: N/A. Family h/o breast cancer: yes maternal grandmother Last colonoscopy: n/a. Results were: N/A. Family h/o colorectal cancer: no HPV vaccine: unsure     09/15/2022    3:25 PM 02/17/2022    3:11 PM 08/01/2017    2:04 PM 07/13/2017   10:30 AM 06/30/2017   10:29 AM  Depression screen PHQ 2/9  Decreased Interest 0 0 0 0 0  Down, Depressed, Hopeless 0 0 0 0 0  PHQ - 2 Score 0 0 0 0 0  Altered sleeping 1 0     Tired, decreased energy 1 0     Change in appetite 1 0     Feeling bad or failure about yourself  0 0     Trouble concentrating 0 0     Moving slowly or fidgety/restless 0 0     Suicidal thoughts 0 0     PHQ-9 Score 3 0     Difficult doing work/chores Not difficult at all            09/15/2022    3:26 PM 02/17/2022    3:11 PM  GAD 7 : Generalized Anxiety Score  Nervous, Anxious, on Edge 0 0  Control/stop worrying 0 0  Worry too much - different things 0 0  Trouble  relaxing 0 0  Restless 0 0  Easily annoyed or irritable 0 0  Afraid - awful might happen 0 0  Total GAD 7 Score 0 0  Anxiety Difficulty Not difficult at all      Review of Systems:   Pertinent items are noted in HPI Denies any headaches, blurred vision, fatigue, shortness of breath, chest pain, abdominal pain, abnormal vaginal discharge/itching/odor/irritation, problems with periods, bowel movements, urination, or intercourse unless otherwise stated above. Pertinent History Reviewed:  Reviewed past medical,surgical, social and family history.  Reviewed problem list, medications and allergies. Physical Assessment:   Vitals:   09/15/22 1510  BP: 105/73  Pulse: 66  Weight: 198 lb (89.8 kg)  Height: 5\' 2"  (1.575 m)  Body mass index is 36.21 kg/m.        Physical Examination:   General appearance - well appearing, and in no distress  Mental status - alert, oriented to person, place, and time  Chest - respiratory effort normal  Heart - normal peripheral perfusion  Pelvic - VULVA: normal appearing vulva with no masses, tenderness  or lesions  VAGINA: normal appearing vagina with normal color and discharge, no lesions  CERVIX: normal appearing cervix without discharge or lesions, no CMT  Thin prep pap is done with reflex HR HPV cotesting  Chaperone present for exam  No results found for this or any previous visit (from the past 24 hour(s)).  Assessment & Plan:  1) Well-Woman Exam Mammogram: @ 24yo, or sooner if problems Colonoscopy: @ 24yo, or sooner if problems Pap: Collected  Gardasil: Pt considering GC/CT: collected HIV/HCV: collected  Labs/procedures today:  Orders Placed This Encounter  Procedures   HIV antibody (with reflex)   Hepatitis C Antibody   Hepatitis B Surface AntiGEN   RPR    Meds: No orders of the defined types were placed in this encounter.  Follow-up: Return in about 1 year (around 09/15/2023) for annual exam.  Lennart Pall, MD 09/15/2022 4:40  PM

## 2022-09-15 NOTE — Progress Notes (Signed)
22 y.o. GYN presents for AEX/PAP/STD screening.

## 2022-09-15 NOTE — Patient Instructions (Signed)
It was nice meeting you today! You will see your pap test results within a week in the MyChart app. Please start taking a prenatal vitamin if you are thinking about getting pregnant.

## 2022-09-16 LAB — CERVICOVAGINAL ANCILLARY ONLY
Chlamydia: NEGATIVE
Comment: NEGATIVE
Comment: NEGATIVE
Comment: NORMAL
Neisseria Gonorrhea: NEGATIVE
Trichomonas: NEGATIVE

## 2022-09-16 LAB — RPR: RPR Ser Ql: NONREACTIVE

## 2022-09-16 LAB — HIV ANTIBODY (ROUTINE TESTING W REFLEX): HIV Screen 4th Generation wRfx: NONREACTIVE

## 2022-09-16 LAB — HEPATITIS C ANTIBODY: Hep C Virus Ab: NONREACTIVE

## 2022-09-16 LAB — HEPATITIS B SURFACE ANTIGEN: Hepatitis B Surface Ag: NEGATIVE

## 2022-09-20 LAB — CYTOLOGY - PAP: Diagnosis: NEGATIVE

## 2023-04-04 ENCOUNTER — Ambulatory Visit: Admitting: Obstetrics and Gynecology

## 2023-04-05 ENCOUNTER — Other Ambulatory Visit (HOSPITAL_COMMUNITY)
Admission: RE | Admit: 2023-04-05 | Discharge: 2023-04-05 | Disposition: A | Discharge status: Home / Self Care | Source: Ambulatory Visit | Attending: Obstetrics & Gynecology | Admitting: Obstetrics & Gynecology

## 2023-04-05 ENCOUNTER — Ambulatory Visit

## 2023-04-05 VITALS — BP 108/73 | HR 62 | Ht 62.0 in | Wt 182.0 lb

## 2023-04-05 DIAGNOSIS — N898 Other specified noninflammatory disorders of vagina: Secondary | ICD-10-CM | POA: Diagnosis present

## 2023-04-05 NOTE — Progress Notes (Signed)
 SUBJECTIVE:  23 y.o. female complains of white, copious, and creamy vaginal discharge for 2 month(s). Denies abnormal vaginal bleeding or significant pelvic pain or fever. No UTI symptoms. Denies history of known exposure to STD.  Patient's last menstrual period was 03/12/2023.  OBJECTIVE:  She appears well, afebrile. Urine dipstick: not done.  ASSESSMENT:  Vaginal Discharge  Vaginal Odor   PLAN:  GC, chlamydia, trichomonas, BVAG, CVAG probe sent to lab. Treatment: To be determined once lab results are received ROV prn if symptoms persist or worsen.

## 2023-04-06 LAB — CERVICOVAGINAL ANCILLARY ONLY
Bacterial Vaginitis (gardnerella): NEGATIVE
Candida Glabrata: NEGATIVE
Candida Vaginitis: POSITIVE — AB
Chlamydia: NEGATIVE
Comment: NEGATIVE
Comment: NEGATIVE
Comment: NEGATIVE
Comment: NEGATIVE
Comment: NEGATIVE
Comment: NORMAL
Neisseria Gonorrhea: NEGATIVE
Trichomonas: NEGATIVE

## 2023-04-07 ENCOUNTER — Encounter: Payer: Self-pay | Admitting: Obstetrics & Gynecology

## 2023-04-07 MED ORDER — FLUCONAZOLE 150 MG PO TABS
150.0000 mg | ORAL_TABLET | Freq: Once | ORAL | 0 refills | Status: AC
Start: 2023-04-07 — End: 2023-04-07

## 2023-04-07 NOTE — Addendum Note (Signed)
 Addended by: Adam Phenix on: 04/07/2023 09:34 AM   Modules accepted: Orders

## 2024-01-12 ENCOUNTER — Ambulatory Visit
Admission: EM | Admit: 2024-01-12 | Discharge: 2024-01-12 | Disposition: A | Discharge status: Home / Self Care | Source: Home / Self Care

## 2024-01-12 ENCOUNTER — Encounter: Payer: Self-pay | Admitting: Emergency Medicine

## 2024-01-12 DIAGNOSIS — J0100 Acute maxillary sinusitis, unspecified: Secondary | ICD-10-CM | POA: Diagnosis not present

## 2024-01-12 MED ORDER — FLUTICASONE PROPIONATE 50 MCG/ACT NA SUSP: 1.0000 | Freq: Every day | NASAL | 0 refills | Status: AC

## 2024-01-12 MED ORDER — PSEUDOEPHEDRINE HCL 30 MG PO TABS: 30.0000 mg | ORAL_TABLET | ORAL | 0 refills | Status: AC | PRN

## 2024-01-12 MED ORDER — AMOXICILLIN-POT CLAVULANATE 875-125 MG PO TABS: 1.0000 | ORAL_TABLET | Freq: Two times a day (BID) | ORAL | 0 refills | Status: AC

## 2024-01-12 NOTE — ED Provider Notes (Signed)
 " [facility]    CSN: [id number] Arrival date & time: 01/12/24  1322      History   Chief Complaint No chief complaint on file.   HPI Patricia Koch is a 24 y.o. female.   Pt presents today due to due to 5 days of left sided facial pain and purulent nasal drainage. Pt states that she has been using OTC cold meds without relief of symptoms. Pt states that now she is experiencing dental pain. Pt states that she has been to the dentist in the last 6 months.   The history is provided by the patient.    Past Medical History:  Diagnosis Date   Anemia    Headache    Pes planus     Patient Active Problem List   Diagnosis Date Noted   Class 1 obesity due to excess calories without serious comorbidity with body mass index (BMI) of 34.0 to 34.9 in adult 03/20/2021   Pes planus 03/10/2012    Past Surgical History:  Procedure Laterality Date   IUD REMOVAL  05/04/2022   Procedure: REMOVAL OF RETAINED INTRAUTERINE DEVICE;  Surgeon: Lorence Ozell CROME, MD;  Location: [facility] OR;  Service: Gynecology;;   NOSE SURGERY     WISDOM TOOTH EXTRACTION      OB History     Gravida  2   Para  2   Term  2   Preterm      AB      Living  2      SAB      IAB      Ectopic      Multiple  0   Live Births  2            Home Medications    Prior to Admission medications  Medication Sig Start Date End Date Taking? Authorizing Provider  amoxicillin -clavulanate (AUGMENTIN ) 875-125 MG tablet Take 1 tablet by mouth every 12 (twelve) hours for 10 days. 01/12/24 01/22/24 Yes Andra Krabbe C, PA-C  fluticasone (FLONASE) 50 MCG/ACT nasal spray Place 1 spray into both nostrils daily. 01/12/24  Yes Andra Krabbe BROCKS, PA-C  pseudoephedrine (SUDAFED) 30 MG tablet Take 1 tablet (30 mg total) by mouth every 4 (four) hours as needed for congestion. 01/12/24  Yes Andra Krabbe BROCKS, PA-C    Family History Family History  Problem Relation Age of Onset   Miscarriages /  Stillbirths Mother    Diabetes Father    Cancer Maternal Grandmother    Diabetes Maternal Grandmother    Hypertension Maternal Grandmother    Cancer Paternal Grandmother     Social History Social History[1]   Allergies   Patient has no known allergies.   Review of Systems Review of Systems   Physical Exam Triage Vital Signs ED Triage Vitals  Encounter Vitals Group     BP 01/12/24 1346 113/83     Girls Systolic BP Percentile --      Girls Diastolic BP Percentile --      Boys Systolic BP Percentile --      Boys Diastolic BP Percentile --      Pulse Rate 01/12/24 1346 88     Resp 01/12/24 1346 18     Temp 01/12/24 1346 97.9 F (36.6 C)     Temp Source 01/12/24 1346 Oral     SpO2 01/12/24 1346 98 %     Weight 01/12/24 1345 190 lb (86.2 kg)     Height --  Head Circumference --      Peak Flow --      Pain Score 01/12/24 1345 10     Pain Loc --      Pain Education --      Exclude from Growth Chart --    No data found.  Updated Vital Signs BP 113/83 (BP Location: Left Arm)   Pulse 88   Temp 97.9 F (36.6 C) (Oral)   Resp 18   Wt 190 lb (86.2 kg)   LMP 12/29/2023 (Approximate)   SpO2 98%   Breastfeeding No   BMI 34.75 kg/m   Visual Acuity Right Eye Distance:   Left Eye Distance:   Bilateral Distance:    Right Eye Near:   Left Eye Near:    Bilateral Near:     Physical Exam Vitals and nursing note reviewed.  Constitutional:      General: She is not in acute distress.    Appearance: Normal appearance. She is not ill-appearing, toxic-appearing or diaphoretic.  HENT:     Nose: Congestion (moderately enlarged turbinates) present. No rhinorrhea.     Right Sinus: No maxillary sinus tenderness or frontal sinus tenderness.     Left Sinus: Maxillary sinus tenderness present. No frontal sinus tenderness.     Comments: Tenderness to palpation of left side of upper lip    Mouth/Throat:     Mouth: Mucous membranes are moist.     Pharynx: Oropharynx is  clear. No oropharyngeal exudate or posterior oropharyngeal erythema.  Eyes:     General: No scleral icterus. Cardiovascular:     Rate and Rhythm: Normal rate and regular rhythm.     Heart sounds: Normal heart sounds.  Pulmonary:     Effort: Pulmonary effort is normal. No respiratory distress.     Breath sounds: Normal breath sounds. No wheezing or rhonchi.  Skin:    General: Skin is warm.  Neurological:     Mental Status: She is alert and oriented to person, place, and time.  Psychiatric:        Mood and Affect: Mood normal.        Behavior: Behavior normal.      UC Treatments / Results  Labs (all labs ordered are listed, but only abnormal results are displayed) Labs Reviewed - No data to display  EKG   Radiology No results found.  Procedures Procedures (including critical care time)  Medications Ordered in UC Medications - No data to display  Initial Impression / Assessment and Plan / UC Course  I have reviewed the triage vital signs and the nursing notes.  Pertinent labs & imaging results that were available during my care of the patient were reviewed by me and considered in my medical decision making (see chart for details).      Final Clinical Impressions(s) / UC Diagnoses   Final diagnoses:  Acute maxillary sinusitis, recurrence not specified     Discharge Instructions      You have been diagnosed with a sinus infection today, some are caused by viruses and others are caused by bacteria.  If your symptoms have been going on for less than 7 days it is most likely that you have a viral sinus infection.  Antibiotics will not work for this and it will have to run its course.  Sinus rinses (using a Nettie pot) or saline rinses are helpful as well as pseudoephedrine, and nasal sprays along with ibuprofen  and Tylenol  for pain.  If you have had your symptoms  for more than 7 days you most likely have a bacterial infection and will be prescribed antibiotics.   Supportive measures given for viral sinus infections will also be helpful for bacterial infections.  If you are using antibiotics you should start to feel better in 2 to 3 days but it is important that you complete antibiotics in their entirety.    ED Prescriptions     Medication Sig Dispense Auth. Provider   amoxicillin -clavulanate (AUGMENTIN ) 875-125 MG tablet Take 1 tablet by mouth every 12 (twelve) hours for 10 days. 20 tablet Jaylea Plourde C, PA-C   pseudoephedrine (SUDAFED) 30 MG tablet Take 1 tablet (30 mg total) by mouth every 4 (four) hours as needed for congestion. 30 tablet Curstin Schmale C, PA-C   fluticasone (FLONASE) 50 MCG/ACT nasal spray Place 1 spray into both nostrils daily. 16 g Andra Corean BROCKS, PA-C      PDMP not reviewed this encounter.     [1]  Social History Tobacco Use   Smoking status: Never    Passive exposure: Never   Smokeless tobacco: Never  Vaping Use   Vaping status: Never Used  Substance Use Topics   Alcohol use: Yes    Comment: twice a month   Drug use: Never     Andra Corean BROCKS, PA-C 01/12/24 1551  "

## 2024-01-12 NOTE — Discharge Instructions (Signed)

## 2024-01-12 NOTE — ED Triage Notes (Signed)
 Pt presents c/o left side facial pain x 5 days. Pt states,  I thought I had a minor cold with just a minor cough. Now the left side of my face hurts like my sinuses. There is pressure under my left eye. I think I have an infection in my mouth as of Tuesday on the left side upper row. That could be causing the pain too.
